# Patient Record
Sex: Female | Born: 1980 | Race: Black or African American | Hispanic: No | Marital: Single | Smoking: Former smoker
Health system: Southern US, Community
[De-identification: ages and names within clinical notes are randomized; demographics above are authoritative.]

## PROBLEM LIST (undated history)

## (undated) ENCOUNTER — Inpatient Hospital Stay (HOSPITAL_COMMUNITY): Payer: Self-pay

## (undated) ENCOUNTER — Inpatient Hospital Stay (HOSPITAL_COMMUNITY): Payer: Medicaid Other

## (undated) DIAGNOSIS — Z87442 Personal history of urinary calculi: Secondary | ICD-10-CM

## (undated) DIAGNOSIS — I10 Essential (primary) hypertension: Secondary | ICD-10-CM

## (undated) DIAGNOSIS — N83209 Unspecified ovarian cyst, unspecified side: Secondary | ICD-10-CM

## (undated) DIAGNOSIS — K219 Gastro-esophageal reflux disease without esophagitis: Secondary | ICD-10-CM

## (undated) DIAGNOSIS — R51 Headache: Secondary | ICD-10-CM

## (undated) HISTORY — PX: OOPHORECTOMY: SHX86

## (undated) HISTORY — PX: TONSILLECTOMY: SUR1361

---

## 2008-04-08 ENCOUNTER — Inpatient Hospital Stay (HOSPITAL_COMMUNITY): Admission: AD | Admit: 2008-04-08 | Discharge: 2008-04-08 | Payer: Self-pay | Admitting: Family Medicine

## 2009-12-24 ENCOUNTER — Other Ambulatory Visit: Payer: Self-pay | Admitting: Emergency Medicine

## 2009-12-24 ENCOUNTER — Emergency Department (HOSPITAL_COMMUNITY): Admission: EM | Admit: 2009-12-24 | Discharge: 2009-12-24 | Payer: Self-pay | Admitting: Emergency Medicine

## 2010-02-27 ENCOUNTER — Emergency Department (HOSPITAL_BASED_OUTPATIENT_CLINIC_OR_DEPARTMENT_OTHER): Admission: EM | Admit: 2010-02-27 | Discharge: 2010-02-27 | Payer: Self-pay | Admitting: Emergency Medicine

## 2011-04-01 LAB — URINALYSIS, ROUTINE W REFLEX MICROSCOPIC
Bilirubin Urine: NEGATIVE
Glucose, UA: NEGATIVE mg/dL
Hgb urine dipstick: NEGATIVE
Ketones, ur: NEGATIVE mg/dL
Protein, ur: NEGATIVE mg/dL
Urobilinogen, UA: 1 mg/dL (ref 0.0–1.0)

## 2011-04-01 LAB — BASIC METABOLIC PANEL
BUN: 13 mg/dL (ref 6–23)
Chloride: 105 mEq/L (ref 96–112)
Creatinine, Ser: 0.7 mg/dL (ref 0.4–1.2)
Glucose, Bld: 85 mg/dL (ref 70–99)

## 2011-04-01 LAB — DIFFERENTIAL
Basophils Absolute: 0 10*3/uL (ref 0.0–0.1)
Basophils Relative: 1 % (ref 0–1)
Eosinophils Absolute: 0.2 10*3/uL (ref 0.0–0.7)
Eosinophils Relative: 4 % (ref 0–5)
Lymphocytes Relative: 33 % (ref 12–46)
Monocytes Absolute: 0.4 10*3/uL (ref 0.1–1.0)

## 2011-04-01 LAB — CBC
HCT: 40 % (ref 36.0–46.0)
Hemoglobin: 13.8 g/dL (ref 12.0–15.0)
MCHC: 34.5 g/dL (ref 30.0–36.0)
Platelets: 241 10*3/uL (ref 150–400)
RDW: 12.7 % (ref 11.5–15.5)

## 2011-04-01 LAB — HCG, QUANTITATIVE, PREGNANCY

## 2011-04-01 LAB — GC/CHLAMYDIA PROBE AMP, GENITAL

## 2011-04-01 LAB — RPR: RPR Ser Ql: NONREACTIVE

## 2011-04-01 LAB — WET PREP, GENITAL
WBC, Wet Prep HPF POC: NONE SEEN
Yeast Wet Prep HPF POC: NONE SEEN

## 2011-07-22 ENCOUNTER — Encounter: Payer: Self-pay | Admitting: *Deleted

## 2011-07-22 ENCOUNTER — Emergency Department (HOSPITAL_BASED_OUTPATIENT_CLINIC_OR_DEPARTMENT_OTHER)
Admission: EM | Admit: 2011-07-22 | Discharge: 2011-07-22 | Disposition: A | Discharge status: Home / Self Care | Payer: Self-pay | Attending: Emergency Medicine | Admitting: Emergency Medicine

## 2011-07-22 DIAGNOSIS — I10 Essential (primary) hypertension: Secondary | ICD-10-CM | POA: Insufficient documentation

## 2011-07-22 DIAGNOSIS — J40 Bronchitis, not specified as acute or chronic: Secondary | ICD-10-CM | POA: Insufficient documentation

## 2011-07-22 DIAGNOSIS — F17200 Nicotine dependence, unspecified, uncomplicated: Secondary | ICD-10-CM | POA: Insufficient documentation

## 2011-07-22 DIAGNOSIS — J45909 Unspecified asthma, uncomplicated: Secondary | ICD-10-CM | POA: Insufficient documentation

## 2011-07-22 DIAGNOSIS — J040 Acute laryngitis: Secondary | ICD-10-CM | POA: Insufficient documentation

## 2011-07-22 DIAGNOSIS — R509 Fever, unspecified: Secondary | ICD-10-CM | POA: Insufficient documentation

## 2011-07-22 HISTORY — DX: Unspecified ovarian cyst, unspecified side: N83.209

## 2011-07-22 HISTORY — DX: Essential (primary) hypertension: I10

## 2011-07-22 MED ORDER — AZITHROMYCIN 250 MG PO TABS: 250.0000 mg | ORAL_TABLET | Freq: Every day | ORAL | Status: AC

## 2011-07-22 MED ORDER — AZITHROMYCIN 250 MG PO TABS
500.0000 mg | ORAL_TABLET | Freq: Once | ORAL | Status: AC
  Administered 2011-07-22: 500 mg via ORAL
  Filled 2011-07-22: qty 2

## 2011-07-22 NOTE — ED Provider Notes (Signed)
History     Chief Complaint  Patient presents with  . Fever   HPI Comments: No better with otc meds.  Patient is a 30 y.o. female presenting with fever. The history is provided by the patient.  Fever Primary symptoms of the febrile illness include fever and cough. Primary symptoms do not include shortness of breath. The current episode started more than 1 week ago. The problem has been gradually worsening.    Past Medical History  Diagnosis Date  . Hypertension   . Asthma   . Ovarian cyst     Past Surgical History  Procedure Date  . Oophorectomy     left ovary and tube  . Tonsillectomy     Family History  Problem Relation Age of Onset  . Hypertension Father     History  Substance Use Topics  . Smoking status: Current Everyday Smoker -- 4.0 packs/day for 10 years  . Smokeless tobacco: Not on file  . Alcohol Use: Yes     occassionally    OB History    Grav Para Term Preterm Abortions TAB SAB Ect Mult Living   5 3              Review of Systems  Constitutional: Positive for fever.  HENT: Positive for sore throat and voice change.   Respiratory: Positive for cough. Negative for shortness of breath.   Cardiovascular: Negative for chest pain and palpitations.  All other systems reviewed and are negative.    Physical Exam  BP 141/86  Pulse 90  Temp(Src) 99.5 F (37.5 C) (Oral)  Resp 20  Ht 5\' 7"  (1.702 m)  Wt 209 lb (94.802 kg)  BMI 32.73 kg/m2  SpO2 100%  LMP 07/09/2011  Physical Exam  Constitutional: She appears well-developed and well-nourished. No distress.  HENT:  Head: Normocephalic and atraumatic.  Right Ear: External ear normal.  Left Ear: External ear normal.  Neck: Normal range of motion. Neck supple.  Cardiovascular: Normal rate, regular rhythm and normal heart sounds.  Exam reveals no gallop and no friction rub.   No murmur heard. Pulmonary/Chest: Effort normal and breath sounds normal. No respiratory distress. She has no wheezes. She  exhibits no tenderness.  Abdominal: Soft. She exhibits no distension. There is no tenderness.  Skin: She is not diaphoretic.    ED Course  Procedures  MDM       Geoffery Lyons 07/23/11 1311

## 2011-07-22 NOTE — ED Notes (Signed)
Migraines, fevers , chills , cold sweats and bodyaches for the last 9 days.  Symptoms have been intermittent. Taking otc medications with minimal relief.

## 2011-08-09 ENCOUNTER — Emergency Department (HOSPITAL_BASED_OUTPATIENT_CLINIC_OR_DEPARTMENT_OTHER)
Admission: EM | Admit: 2011-08-09 | Discharge: 2011-08-09 | Disposition: A | Discharge status: Home / Self Care | Payer: Self-pay | Attending: Emergency Medicine | Admitting: Emergency Medicine

## 2011-08-09 ENCOUNTER — Encounter (HOSPITAL_BASED_OUTPATIENT_CLINIC_OR_DEPARTMENT_OTHER): Payer: Self-pay

## 2011-08-09 DIAGNOSIS — B9689 Other specified bacterial agents as the cause of diseases classified elsewhere: Secondary | ICD-10-CM

## 2011-08-09 DIAGNOSIS — N390 Urinary tract infection, site not specified: Secondary | ICD-10-CM

## 2011-08-09 DIAGNOSIS — M549 Dorsalgia, unspecified: Secondary | ICD-10-CM | POA: Insufficient documentation

## 2011-08-09 DIAGNOSIS — A499 Bacterial infection, unspecified: Secondary | ICD-10-CM | POA: Insufficient documentation

## 2011-08-09 DIAGNOSIS — N760 Acute vaginitis: Secondary | ICD-10-CM | POA: Insufficient documentation

## 2011-08-09 DIAGNOSIS — R109 Unspecified abdominal pain: Secondary | ICD-10-CM | POA: Insufficient documentation

## 2011-08-09 LAB — URINALYSIS, ROUTINE W REFLEX MICROSCOPIC
Glucose, UA: NEGATIVE mg/dL
Ketones, ur: NEGATIVE mg/dL
Protein, ur: 100 mg/dL — AB
Urobilinogen, UA: 0.2 mg/dL (ref 0.0–1.0)

## 2011-08-09 LAB — WET PREP, GENITAL: Yeast Wet Prep HPF POC: NONE SEEN

## 2011-08-09 LAB — URINE MICROSCOPIC-ADD ON

## 2011-08-09 LAB — PREGNANCY, URINE: Preg Test, Ur: NEGATIVE

## 2011-08-09 MED ORDER — CEFIXIME 400 MG PO TABS
400.0000 mg | ORAL_TABLET | Freq: Once | ORAL | Status: AC
  Administered 2011-08-09: 400 mg via ORAL
  Filled 2011-08-09: qty 1

## 2011-08-09 MED ORDER — HYDROCODONE-ACETAMINOPHEN 5-325 MG PO TABS
2.0000 | ORAL_TABLET | Freq: Once | ORAL | Status: AC
  Administered 2011-08-09: 2 via ORAL
  Filled 2011-08-09: qty 2

## 2011-08-09 MED ORDER — CEPHALEXIN 500 MG PO CAPS: 500.0000 mg | ORAL_CAPSULE | Freq: Four times a day (QID) | ORAL | Status: AC

## 2011-08-09 MED ORDER — AZITHROMYCIN 250 MG PO TABS
1000.0000 mg | ORAL_TABLET | Freq: Once | ORAL | Status: AC
  Administered 2011-08-09: 1000 mg via ORAL
  Filled 2011-08-09: qty 4

## 2011-08-09 MED ORDER — METRONIDAZOLE 500 MG PO TABS: 500.0000 mg | ORAL_TABLET | Freq: Two times a day (BID) | ORAL | Status: AC

## 2011-08-09 MED ORDER — ONDANSETRON HCL 8 MG PO TABS
4.0000 mg | ORAL_TABLET | Freq: Once | ORAL | Status: DC
  Filled 2011-08-09: qty 1

## 2011-08-09 MED ORDER — METRONIDAZOLE 500 MG PO TABS
500.0000 mg | ORAL_TABLET | Freq: Once | ORAL | Status: AC
  Administered 2011-08-09: 500 mg via ORAL
  Filled 2011-08-09: qty 1

## 2011-08-09 MED ORDER — ONDANSETRON 4 MG PO TBDP
ORAL_TABLET | ORAL | Status: AC
  Administered 2011-08-09: 4 mg via ORAL
  Filled 2011-08-09: qty 1

## 2011-08-09 MED ORDER — HYDROCODONE-ACETAMINOPHEN 5-325 MG PO TABS: ORAL_TABLET | ORAL | Status: DC

## 2011-08-09 NOTE — ED Provider Notes (Signed)
History     CSN: [id number] Arrival date & time: 08/09/2011  3:27 PM  Chief Complaint  Patient presents with  . Back Pain  . Abdominal Pain  . Flank Pain   Patient is a 30 y.o. female presenting with back pain, abdominal pain, and flank pain. The history is provided by the patient.  Back Pain  This is a new problem. The current episode started more than 2 days ago. The problem occurs constantly. The problem has been gradually worsening. The pain is associated with no known injury. Pain location: rt flank/lower back. The quality of the pain is described as aching. Radiates to: pubis. The pain is at a severity of 9/10. The pain is severe. Associated symptoms include abdominal pain. Pertinent negatives include no chest pain, no fever, no bladder incontinence and no dysuria. She has tried analgesics for the symptoms.  Abdominal Pain The primary symptoms of the illness include abdominal pain. The primary symptoms of the illness do not include fever, shortness of breath or dysuria.  Additional symptoms associated with the illness include back pain. Symptoms associated with the illness do not include hematuria or frequency.  Flank Pain Associated symptoms include abdominal pain. Pertinent negatives include no arthralgias, chest pain, coughing, fever or neck pain.    Past Medical History  Diagnosis Date  . Hypertension   . Asthma   . Ovarian cyst     Past Surgical History  Procedure Date  . Oophorectomy     left ovary and tube  . Tonsillectomy   . Oophorectomy     Family History  Problem Relation Age of Onset  . Hypertension Father     History  Substance Use Topics  . Smoking status: Current Everyday Smoker -- 4.0 packs/day for 10 years  . Smokeless tobacco: Not on file  . Alcohol Use: Yes     occassionally    OB History    Grav Para Term Preterm Abortions TAB SAB Ect Mult Living   5 3              Review of Systems  Constitutional: Negative for fever and activity  change.       All ROS Neg except as noted in HPI  HENT: Negative for nosebleeds and neck pain.   Eyes: Negative for photophobia and discharge.  Respiratory: Negative for cough, shortness of breath and wheezing.   Cardiovascular: Negative for chest pain and palpitations.  Gastrointestinal: Positive for abdominal pain. Negative for blood in stool.  Genitourinary: Positive for flank pain. Negative for bladder incontinence, dysuria, frequency and hematuria.  Musculoskeletal: Positive for back pain. Negative for arthralgias.  Skin: Negative.   Neurological: Negative for dizziness, seizures and speech difficulty.  Psychiatric/Behavioral: Negative for hallucinations and confusion.    Physical Exam  BP 132/96  Pulse 72  Temp(Src) 98.4 F (36.9 C) (Oral)  Resp 16  Ht 5\' 7"  (1.702 m)  Wt 203 lb (92.08 kg)  BMI 31.79 kg/m2  SpO2 99%  LMP 08/05/2011  Physical Exam  Nursing note and vitals reviewed. Constitutional: She is oriented to person, place, and time. She appears well-developed and well-nourished.  Non-toxic appearance.  HENT:  Head: Normocephalic.  Right Ear: Tympanic membrane and external ear normal.  Left Ear: Tympanic membrane and external ear normal.  Eyes: EOM and lids are normal. Pupils are equal, round, and reactive to light.  Neck: Normal range of motion. Neck supple. Carotid bruit is not present.  Cardiovascular: Normal rate, regular rhythm, normal heart sounds,  intact distal pulses and normal pulses.   Pulmonary/Chest: Breath sounds normal. No respiratory distress.  Abdominal: Soft. Bowel sounds are normal. There is tenderness. There is no guarding.       Rt lower quad tenderness.  Genitourinary:       Rt CVA tenderness. Chaperone present during pelvic exam. External structures wnl. No fb of the vaginal vault. Mild blood discharge for os of cervix (pt ending cycle). Mild to mod cervical motion tenderness. No adnexal mass noted.  Musculoskeletal: Normal range of motion.    Lymphadenopathy:       Head (right side): No submandibular adenopathy present.       Head (left side): No submandibular adenopathy present.    She has no cervical adenopathy.  Neurological: She is alert and oriented to person, place, and time. She has normal strength. No cranial nerve deficit or sensory deficit.  Skin: Skin is warm and dry.  Psychiatric: She has a normal mood and affect. Her speech is normal.    ED Course  Procedures  MDM I have reviewed nursing notes, vital signs, and all appropriate lab and imaging results for this patient. R/o UTI, PID, Kidney Stone, Ovarian cyst.      Kathie Dike, PA 08/09/11 [address] [place], [place] 08/09/11 [phone]

## 2011-08-09 NOTE — ED Notes (Signed)
Pt was made aware of wait for wet prep and that EDPA would d/c asap-agreeable

## 2011-08-09 NOTE — ED Notes (Signed)
Pt reports urinary frequency, low back, right flank pain radiating to pubic area. Onset Tuesday

## 2011-08-10 LAB — GC/CHLAMYDIA PROBE AMP, GENITAL
Chlamydia, DNA Probe: NEGATIVE
GC Probe Amp, Genital: NEGATIVE

## 2011-08-18 NOTE — ED Provider Notes (Signed)
Medical screening examination/treatment/procedure(s) were performed by non-physician practitioner and as supervising physician I was immediately available for consultation/collaboration.   Cyndra Numbers, MD 08/18/11 [phone]

## 2011-10-28 LAB — CBC
Hemoglobin: 12.6 g/dL (ref 12.0–16.0)
Platelets: 245 10*3/uL (ref 150–399)
Platelets: 245 10*3/uL (ref 150–399)

## 2011-10-28 LAB — GLUCOSE TOLERANCE, 1 HOUR: Glucose, 1 hour: 81

## 2011-11-05 DIAGNOSIS — O039 Complete or unspecified spontaneous abortion without complication: Secondary | ICD-10-CM | POA: Insufficient documentation

## 2011-11-07 ENCOUNTER — Ambulatory Visit (INDEPENDENT_AMBULATORY_CARE_PROVIDER_SITE_OTHER): Payer: Medicaid Other | Admitting: Family Medicine

## 2011-11-07 DIAGNOSIS — N390 Urinary tract infection, site not specified: Secondary | ICD-10-CM

## 2011-11-07 DIAGNOSIS — O2340 Unspecified infection of urinary tract in pregnancy, unspecified trimester: Secondary | ICD-10-CM

## 2011-11-07 DIAGNOSIS — O10019 Pre-existing essential hypertension complicating pregnancy, unspecified trimester: Secondary | ICD-10-CM

## 2011-11-07 DIAGNOSIS — O2390 Unspecified genitourinary tract infection in pregnancy, unspecified trimester: Secondary | ICD-10-CM

## 2011-11-07 DIAGNOSIS — J45909 Unspecified asthma, uncomplicated: Secondary | ICD-10-CM | POA: Insufficient documentation

## 2011-11-07 DIAGNOSIS — O039 Complete or unspecified spontaneous abortion without complication: Secondary | ICD-10-CM

## 2011-11-07 DIAGNOSIS — N883 Incompetence of cervix uteri: Secondary | ICD-10-CM | POA: Insufficient documentation

## 2011-11-07 HISTORY — DX: Unspecified infection of urinary tract in pregnancy, unspecified trimester: O23.40

## 2011-11-07 HISTORY — DX: Pre-existing essential hypertension complicating pregnancy, unspecified trimester: O10.019

## 2011-11-07 HISTORY — DX: Incompetence of cervix uteri: N88.3

## 2011-11-07 LAB — POCT URINALYSIS DIP (DEVICE)
Bilirubin Urine: NEGATIVE
Ketones, ur: NEGATIVE mg/dL
Nitrite: POSITIVE — AB

## 2011-11-07 NOTE — Progress Notes (Signed)
Pelvic pain.  Had flu shot at [facility]. Needs nutrition and SW today

## 2011-11-07 NOTE — Progress Notes (Signed)
Nutrition Note:  1st consult @ [facility].  Pt receives [facility] services.  Dx. HTN; asthma; ovarian cyst; overwt. Current wt gain of 7# @ [redacted]w[redacted]d gestation is excessive, plots 3# > expected.  Pt reports large appetite of 3 big meals and 3 snacks daily.  No food allergies or vomiting reported; does experience nausea.  Pt does consume an excessive amount of orange juice 4-5 times daily, which likely contributes 500+ extra calories daily. Pt reports working and walking daily. Will start to dilute all orange juice and find other zero calorie beverages. Follow up in 4-6 weeks. Cy Blamer, RD

## 2011-11-07 NOTE — Patient Instructions (Addendum)
Cervical Insufficiency Cervical insufficiency (CI) is when the cervix is not strong enough to keep a baby (fetus) inside the womb (uterus). It occurs in the 2nd and early 3rd trimesters of pregnancy. The cervix will enlarge (dilate) on its own without contractions. When this happens, the membranes around the fetus will often balloon down into the birth canal (vagina). The membranes may break, which could end the pregnancy (miscarriage). CAUSES  The cause of a cervical insufficiency is often not known. Possible causes include:  Injury to the cervix from a past pregnancy.   Injury to the cervix from past surgeries.   Being born with this defect of the cervix.   Cold cone, laser or LEEP (Loop electrocautery excision procedure) to the cervix.   Over dilating the cervix during an abortion.   Being exposed to DES (diethylstilbestrol) during pregnancy.   Lack of tissue (elastin and collagen) in the cervix that holds the baby in uterus.   Shorter cervix than normal.  SYMPTOMS   Spotting or bleeding from the vagina.   Feeling pressure in the vagina.   Unusual or abnormal vaginal discharge.  DIAGNOSIS   In many cases, the diagnosis is not made until after the pregnancy is lost.   Often this diagnosis will be made by exam.   Sometimes, an ultrasound of the cervix may be helpful. The ultrasound measures and follows the length of the cervix in women who are at risk of having CI.   Your caregiver may follow the dilatation of the cervix. Often, the diagnosis cannot be made until it happens. When this is the case, there is a much greater chance of early loss of the pregnancy. This means the baby is born too early to survive outside of the mother.  PREVENTION   A high risk patient needs to get frequent vaginal exams and serial ultrasounds.   Tie a suture, like a purse string, around the cervix (cerclage), before getting pregnant.  TREATMENT  When CI is diagnosed early, the treatment is a  cerclage. This gives the cervix added support. The cerclage helps carry the baby to term. This is usually done before the first trimester (12 to 14 weeks). Cerclage is usually not done after the second trimester (24 weeks) unless it is an emergency. Your caregiver can discuss the risks of this procedure. The cerclage suture may be removed when labor begins or at term before labor begins. The suture can also be left in place for future pregnancies. If left in place, the baby is delivered by Cesarean section. HOME CARE INSTRUCTIONS   Keep your follow-up prenatal appointments.   Take medication as directed by your caregiver.   Avoid physical activities, exercise and sexual intercourse until you have permission from your caregiver.   Do not douche or use tampons.   Resume your usual diet.  SEEK MEDICAL CARE IF:  You develop abnormal vaginal discharge. SEEK IMMEDIATE MEDICAL CARE IF:   You have a fever.   You develop uterine contractions.   You do not feel the baby moving or the baby is not moving as much as usual.   You pass out.   You have vaginal bleeding.   You are leaking fluid or have a gush of fluid from your vagina.   You have blood in your urine or pain when urinating.  Document Released: 12/16/2005 Document Revised: 08/28/2011 Document Reviewed: 04/05/2009 [facility] Patient Information 2012 [place], [place].Hypertension During Pregnancy Hypertension is also called high blood pressure. It can occur at any  time in life and during pregnancy. When you have hypertension, there is extra pressure inside your blood vessels that carry blood from the heart to the rest of your body (arteries). Hypertension during pregnancy can cause problems for you and your baby. Your baby might not weigh as much as it should at birth or might be born early (premature). Very bad cases of hypertension during pregnancy can be life-threatening.  There are different types of hypertension during pregnancy.    Chronic hypertension. This happens when a woman has hypertension before pregnancy and it continues during pregnancy.   Gestational hypertension. This is when hypertension develops during pregnancy.   Preeclampsia or toxemia of pregnancy. This is a very serious type of hypertension that develops only during pregnancy. It is a disease that affects the whole body (systemic) and can be very dangerous for both mother and baby.   Gestational hypertension and preeclampsia usually go away after your baby is born. Blood pressure generally stabilizes within 6 weeks. Women who have hypertension during pregnancy have a greater chance of developing hypertension later in life or with future pregnancies. UNDERSTANDING BLOOD PRESSURE Blood pressure moves blood in your body. Sometimes, the force that moves the blood becomes too strong.  A blood pressure reading is given in 2 numbers and looks like a fraction.   The top number is called the systolic pressure. When your heart beats, it forces more blood to flow through the arteries. Pressure inside the arteries goes up.   The bottom number is the diastolic pressure. Pressure goes down between beats. That is when the heart is resting.   You may have hypertension if:   Your systolic blood pressure is above 140.   Your diastolic pressure is above 90.  RISK FACTORS Some factors make you more likely to develop hypertension during pregnancy. Risk factors include:  Having hypertension before pregnancy.   Having hypertension during a previous pregnancy.   Being overweight.   Being older than 40.   Being pregnant with more than 1 baby (multiples).   Having diabetes or kidney problems.  SYMPTOMS Chronic and gestational hypertension may not cause symptoms. Preeclampsia has symptoms, which may include:  Increased protein in your urine. Your caregiver will check for this at every prenatal visit.   Swelling of your hands and face.   Rapid weight gain.    Headaches.   Visual changes.   Being bothered by light.   Abdominal pain, especially in the right upper area.   Chest pain.   Shortness of breath.   Increased reflexes.   Seizures. Seizures occur with a more severe form of preeclampsia, called eclampsia.  DIAGNOSIS   You may be diagnosed with hypertension during pregnancy during a regular prenatal exam. At each visit, tests may include:   Blood pressure checks.   A urine test to check for protein in your urine.   The type of hypertension you are diagnosed with depends on when you developed it. It also depends on your specific blood pressure reading.   Developing hypertension before 20 weeks of pregnancy is consistent with chronic hypertension.   Developing hypertension after 20 weeks of pregnancy is consistent with gestational hypertension.   Hypertension with increased urinary protein is diagnosed as preeclampsia.   Blood pressure measurements that stay above 160 systolic or 110 diastolic are a sign of severe preeclampsia.  TREATMENT Treatment for hypertension during pregnancy varies. Treatment depends on the type of hypertension and how serious it is.  If you take medicine  for chronic hypertension, you may need to switch medicines.   Drugs called ACE inhibitors should not be taken during pregnancy.   Low-dose aspirin may be suggested for women who have risk factors for preeclampsia.   If you have gestational hypertension, you may need to take a blood pressure medicine that is safe during pregnancy. Your caregiver will recommend the appropriate medicine.   If you have severe preeclampsia, you may need to be in the hospital. Caregivers will watch you and the baby very closely. You also may need to take medicine (magnesium sulfate) to prevent seizures and lower blood pressure.   Sometimes an early delivery is needed. This may be the case if the condition worsens. It would be done to protect you and the baby. The only  cure for preeclampsia is delivery.  HOME CARE INSTRUCTIONS  Schedule and keep all of your regular prenatal care.   Follow your caregiver's instructions for taking medicines. Tell your caregiver about all medicines you take. This includes over-the-counter medicines.   Eat as little salt as possible.   Get regular exercise.   Do not drink alcohol.   Do not use tobacco products.   Do not drink products with caffeine.   Lie on your left side when resting.   Tell your doctor if you have any preeclampsia symptoms.  SEEK IMMEDIATE MEDICAL CARE IF:  You have severe abdominal pain.   You have sudden swelling in the hands, ankles, or face.   You gain 4 pounds (1.8 kg) or more in 1 week.   You vomit repeatedly.   You have vaginal bleeding.   You do not feel the baby moving as much.   You have a headache.   You have blurred or double vision.   You have muscle twitching or spasms.   You have shortness of breath.   You have blue fingernails and lips.   You have blood in your urine.  MAKE SURE YOU:  Understand these instructions.   Will watch your condition.   Will get help right away if you are not doing well.  Document Released: 09/03/2011 Document Reviewed: 08/02/2011 [facility] Patient Information 2012 [place], [place].Sterilization, Women Sterilization is a surgical procedure. This surgery permanently prevents pregnancy in women. This can be done by tying (with or without cutting) the fallopian tubes or burning the tubes closed (tubal ligation). Tubal ligation blocks the tubes and prevents the egg from being fertilized by the sperm. Sterilization can be done by removing the ovaries that produce the egg (castration) as well. Sterilization is considered safe with very rare complications. It does not affect menstrual periods, sexual desire, or performance.  Since sterilization is considered permanent, you should not do it until you are sure you do not want to have more  children. You and your partner should fully agree to have the procedure. Your decision to have the procedure should not be made when you are in a stressful situation. This can include a loss of a pregnancy, illness or death of a spouse, or divorce. There are other means of preventing unwanted pregnancies that can be used until you are completely sure you want to be sterilized. Sterilization does not protect against sexually transmitted disease. Women who had a sterilization procedure and want it reversed must know that it requires an expensive and major operation. The reversal may not be successful and has a high rate of tubal (ectopic) pregnancy that can be dangerous and require surgery. There are several ways to perform a  tubal sterlization:  Laparoscopy. The abdomen is filled with a gas to see the pelvic organs. Then, a tube with a light attached is inserted into the abdomen through 2 small incisions. The fallopian tubes are blocked with a ring, clip or electrocautery to burn closed the tubes. Then, the gas is released and the small incisions are closed.   Hysteroscopy. A tube with a light is inserted in the vagina, through the cervix and then into the uterus. A spring-like instrument is inserted into the opening of the fallopian tubes. The spring causes scaring and blocks the tubes. Other forms of contraception should be used for three months at which time an X-ray is done to be sure the tubes are blocked.   Minilaparotomy. This is done right after giving birth. A small incision is made under the belly button and the tubes are exposed. The tubes can then be burned, tied and/or cut.   Tubal ligation can be done during a Cesarean section.   Castration is a surgical procedure that removes both ovaries.  Tubal sterilization should be discussed with your caregiver to answer any concerns you or your partner might have. This meeting will help to decide for sure if the operation is safe for you and which  procedure is the best one for you. You can change your mind and cancel the surgery at any time. HOME CARE INSTRUCTIONS   Follow your caregivers instructions regarding diet, rest, work, social and sexual activities and follow up appointments.   Shoulder pain is common following a laparoscopy. The pain may be relieved by lying down flat.   Only take over-the-counter or prescription medicines for pain, discomfort or fever as directed by your caregiver.   You may use lozenges for throat discomfort.   Keep the incisions covered to prevent infection.  SEEK IMMEDIATE MEDICAL CARE IF:   You develop a temperature of 102 F (38.9 C), or as your caregiver suggests.   You become dizzy or faint.   You start to feel sick to your stomach (nausea) or throw up (vomit).   You develop abdominal pain not relieved with over-the-counter medications.   You have redness and puffiness (swelling) of the cut (incision).   You see pus draining from the incision.   You miss a menstrual period.  Document Released: 06/03/2008 Document Revised: 08/28/2011 Document Reviewed: 06/03/2008 [facility] Patient Information 2012 [place], [place]. Pregnancy - First Trimester During sexual intercourse, millions of sperm go into the vagina. Only 1 sperm will penetrate and fertilize the female egg while it is in the Fallopian tube. One week later, the fertilized egg implants into the wall of the uterus. An embryo begins to develop into a baby. At 6 to 8 weeks, the eyes and face are formed and the heartbeat can be seen on ultrasound. At the end of 12 weeks (first trimester), all the baby's organs are formed. Now that you are pregnant, you will want to do everything you can to have a healthy baby. Two of the most important things are to get good prenatal care and follow your caregiver's instructions. Prenatal care is all the medical care you receive before the baby's birth. It is given to prevent, find, and treat problems during the  pregnancy and childbirth. PRENATAL EXAMS  During prenatal visits, your weight, blood pressure and urine are checked. This is done to make sure you are healthy and progressing normally during the pregnancy.   A pregnant woman should gain 25 to 35 pounds during the pregnancy.  However, if you are over weight or underweight, your caregiver will advise you regarding your weight.   Your caregiver will ask and answer questions for you.   Blood work, cervical cultures, other necessary tests and a Pap test are done during your prenatal exams. These tests are done to check on your health and the probable health of your baby. Tests are strongly recommended and done for HIV with your permission. This is the virus that causes AIDS. These tests are done because medications can be given to help prevent your baby from being born with this infection should you have been infected without knowing it. Blood work is also used to find out your blood type, previous infections and follow your blood levels (hemoglobin).   Low hemoglobin (anemia) is common during pregnancy. Iron and vitamins are given to help prevent this. Later in the pregnancy, blood tests for diabetes will be done along with any other tests if any problems develop. You may need tests to make sure you and the baby are doing well.   You may need other tests to make sure you and the baby are doing well.  CHANGES DURING THE FIRST TRIMESTER (THE FIRST 3 MONTHS OF PREGNANCY) Your body goes through many changes during pregnancy. They vary from person to person. Talk to your caregiver about changes you notice and are concerned about. Changes can include:  Your menstrual period stops.   The egg and sperm carry the genes that determine what you look like. Genes from you and your partner are forming a baby. The female genes determine whether the baby is a boy or a girl.   Your body increases in girth and you may feel bloated.   Feeling sick to your stomach  (nauseous) and throwing up (vomiting). If the vomiting is uncontrollable, call your caregiver.   Your breasts will begin to enlarge and become tender.   Your nipples may stick out more and become darker.   The need to urinate more. Painful urination may mean you have a bladder infection.   Tiring easily.   Loss of appetite.   Cravings for certain kinds of food.   At first, you may gain or lose a couple of pounds.   You may have changes in your emotions from day to day (excited to be pregnant or concerned something may go wrong with the pregnancy and baby).   You may have more vivid and strange dreams.  HOME CARE INSTRUCTIONS   It is very important to avoid all smoking, alcohol and un-prescribed drugs during your pregnancy. These affect the formation and growth of the baby. Avoid chemicals while pregnant to ensure the delivery of a healthy infant.   Start your prenatal visits by the 12th week of pregnancy. They are usually scheduled monthly at first, then more often in the last 2 months before delivery. Keep your caregiver's appointments. Follow your caregiver's instructions regarding medication use, blood and lab tests, exercise, and diet.   During pregnancy, you are providing food for you and your baby. Eat regular, well-balanced meals. Choose foods such as meat, fish, milk and other low fat dairy products, vegetables, fruits, and whole-grain breads and cereals. Your caregiver will tell you of the ideal weight gain.   You can help morning sickness by keeping soda crackers at the bedside. Eat a couple before arising in the morning. You may want to use the crackers without salt on them.   Eating 4 to 5 small meals rather than 3 large  meals a day also may help the nausea and vomiting.   Drinking liquids between meals instead of during meals also seems to help nausea and vomiting.   A physical sexual relationship may be continued throughout pregnancy if there are no other problems.  Problems may be early (premature) leaking of amniotic fluid from the membranes, vaginal bleeding, or belly (abdominal) pain.   Exercise regularly if there are no restrictions. Check with your caregiver or physical therapist if you are unsure of the safety of some of your exercises. Greater weight gain will occur in the last 2 trimesters of pregnancy. Exercising will help:   Control your weight.   Keep you in shape.   Prepare you for labor and delivery.   Help you lose your pregnancy weight after you deliver your baby.   Wear a good support or jogging bra for breast tenderness during pregnancy. This may help if worn during sleep too.   Ask when prenatal classes are available. Begin classes when they are offered.   Do not use hot tubs, steam rooms or saunas.   Wear your seat belt when driving. This protects you and your baby if you are in an accident.   Avoid raw meat, uncooked cheese, cat litter boxes and soil used by cats throughout the pregnancy. These carry germs that can cause birth defects in the baby.   The first trimester is a good time to visit your dentist for your dental health. Getting your teeth cleaned is OK. Use a softer toothbrush and brush gently during pregnancy.   Ask for help if you have financial, counseling or nutritional needs during pregnancy. Your caregiver will be able to offer counseling for these needs as well as refer you for other special needs.   Do not take any medications or herbs unless told by your caregiver.   Inform your caregiver if there is any mental or physical domestic violence.   Make a list of emergency phone numbers of family, friends, hospital, and police and fire departments.   Write down your questions. Take them to your prenatal visit.   Do not douche.   Do not cross your legs.   If you have to stand for long periods of time, rotate you feet or take small steps in a circle.   You may have more vaginal secretions that may require a  sanitary pad. Do not use tampons or scented sanitary pads.  MEDICATIONS AND DRUG USE IN PREGNANCY  Take prenatal vitamins as directed. The vitamin should contain 1 milligram of folic acid. Keep all vitamins out of reach of children. Only a couple vitamins or tablets containing iron may be fatal to a baby or young child when ingested.   Avoid use of all medications, including herbs, over-the-counter medications, not prescribed or suggested by your caregiver. Only take over-the-counter or prescription medicines for pain, discomfort, or fever as directed by your caregiver. Do not use aspirin, ibuprofen, or naproxen unless directed by your caregiver.   Let your caregiver also know about herbs you may be using.   Alcohol is related to a number of birth defects. This includes fetal alcohol syndrome. All alcohol, in any form, should be avoided completely. Smoking will cause low birth rate and premature babies.   Street or illegal drugs are very harmful to the baby. They are absolutely forbidden. A baby born to an addicted mother will be addicted at birth. The baby will go through the same withdrawal an adult does.   Let  your caregiver know about any medications that you have to take and for what reason you take them.  MISCARRIAGE IS COMMON DURING PREGNANCY A miscarriage does not mean you did something wrong. It is not a reason to worry about getting pregnant again. Your caregiver will help you with questions you may have. If you have a miscarriage, you may need minor surgery. SEEK MEDICAL CARE IF:  You have any concerns or worries during your pregnancy. It is better to call with your questions if you feel they cannot wait, rather than worry about them. SEEK IMMEDIATE MEDICAL CARE IF:   An unexplained oral temperature above 102 F (38.9 C) develops, or as your caregiver suggests.   You have leaking of fluid from the vagina (birth canal). If leaking membranes are suspected, take your temperature and  inform your caregiver of this when you call.   There is vaginal spotting or bleeding. Notify your caregiver of the amount and how many pads are used.   You develop a bad smelling vaginal discharge with a change in the color.   You continue to feel sick to your stomach (nauseated) and have no relief from remedies suggested. You vomit blood or coffee ground-like materials.   You lose more than 2 pounds of weight in 1 week.   You gain more than 2 pounds of weight in 1 week and you notice swelling of your face, hands, feet, or legs.   You gain 5 pounds or more in 1 week (even if you do not have swelling of your hands, face, legs, or feet).   You get exposed to [place] measles and have never had them.   You are exposed to fifth disease or chickenpox.   You develop belly (abdominal) pain. Round ligament discomfort is a common non-cancerous (benign) cause of abdominal pain in pregnancy. Your caregiver still must evaluate this.   You develop headache, fever, diarrhea, pain with urination, or shortness of breath.   You fall or are in a car accident or have any kind of trauma.   There is mental or physical violence in your home.  Document Released: 12/10/2001 Document Revised: 08/28/2011 Document Reviewed: 06/13/2009 [facility] Patient Information 2012 [place], [place].

## 2011-11-07 NOTE — Progress Notes (Signed)
NOB-h/o 16 week silent dilation and delivery, last preg.  Prev. FT delivery x 3.  One TAB preceding. For cerclage @ 13 wks Needs First trim. Screen. HTN since July--BP is up, will hold on meds until next visit-baseline labs.

## 2011-11-09 LAB — CULTURE, OB URINE

## 2011-11-11 ENCOUNTER — Telehealth: Payer: Self-pay | Admitting: *Deleted

## 2011-11-11 DIAGNOSIS — O2340 Unspecified infection of urinary tract in pregnancy, unspecified trimester: Secondary | ICD-10-CM

## 2011-11-11 DIAGNOSIS — O10019 Pre-existing essential hypertension complicating pregnancy, unspecified trimester: Secondary | ICD-10-CM

## 2011-11-11 DIAGNOSIS — O039 Complete or unspecified spontaneous abortion without complication: Secondary | ICD-10-CM

## 2011-11-11 MED ORDER — CEPHALEXIN 500 MG PO CAPS: 500.0000 mg | ORAL_CAPSULE | Freq: Three times a day (TID) | ORAL | Status: AC

## 2011-11-11 NOTE — Telephone Encounter (Signed)
Message copied by Mannie Stabile on Mon Nov 11, 2011  8:37 AM ------      Message from: Reva Bores      Created: Sun Nov 10, 2011  6:30 AM       Citrobacter UTI--needs rx-Keflex 500mg  po tid x 7 d # 21

## 2011-11-11 NOTE — Telephone Encounter (Signed)
Spoke with patient advised her that we have sent in her prescription for her abx. Pt agrees with plan and will pick up her medications at pharmacy.

## 2011-11-20 ENCOUNTER — Encounter (HOSPITAL_COMMUNITY): Payer: Self-pay | Admitting: Pharmacist

## 2011-11-25 ENCOUNTER — Telehealth: Payer: Self-pay | Admitting: *Deleted

## 2011-11-25 NOTE — Patient Instructions (Addendum)
   Your procedure is scheduled WU:[id number] December 3rd  Enter through the Main Entrance of [facility] at:9am Pick up the phone at the desk and dial [phone] and inform us of your arrival.  Please call this number if you have any problems the morning of surgery: [phone]  Remember: Do not eat food after midnight:Sunday Do not drink clear liquids after:midnight Sunday Take these medicines the morning of surgery with a SIP OF WATER:none, does not have current inhaler  Do not wear jewelry, make-up, or FINGER nail polish Do not wear lotions, powders, or perfumes.  You may not  wear deodorant. Do not shave 48 hours prior to surgery. Do not bring valuables to the hospital.  Leave suitcase in the car. After Surgery it may be brought to your room. For patients being admitted to the hospital, checkout time is 11:00am the day of discharge.  Patients discharged on the day of surgery will not be allowed to drive home.     Remember to use your hibiclens as instructed.Please shower with 1/2 bottle the evening before your surgery and the other 1/2 bottle the morning of surgery.

## 2011-11-25 NOTE — Telephone Encounter (Signed)
Pt left message that she is having pain and has questions- wants to speak to a nurse.  I returned pt call and discussed her pain. She states that since early this morning she is having a pain on the lt side of pelvis. The pain is stronger when she is standing and is non-existent when sitting straight. She denies vaginal bleeding or severe pain. I told pt that I think she has round ligament pain and provided explanation. Pt asked if this will prevent her from working because she stands for 8 hrs without getting a break or lunch. I told pt that only she can decide whether she will be able to work and some days may be worse than others. I also recommended that pt discuss the situation with the doctor and social worker @ her next visit on 11/29.  I advised pt to go to [facility] if she should have severe abdominal/pelvic pain or vaginal bleeding. Pt voiced understanding.

## 2011-11-27 ENCOUNTER — Encounter (HOSPITAL_COMMUNITY): Payer: Self-pay

## 2011-11-27 ENCOUNTER — Encounter (HOSPITAL_COMMUNITY)
Admission: RE | Admit: 2011-11-27 | Discharge: 2011-11-27 | Disposition: A | Discharge status: Home / Self Care | Payer: Medicaid Other | Source: Ambulatory Visit | Attending: Obstetrics & Gynecology | Admitting: Obstetrics & Gynecology

## 2011-11-27 LAB — CBC
Hemoglobin: 12.8 g/dL (ref 12.0–15.0)
MCHC: 33.9 g/dL (ref 30.0–36.0)
RBC: 4.15 MIL/uL (ref 3.87–5.11)

## 2011-11-28 ENCOUNTER — Other Ambulatory Visit: Payer: Self-pay | Admitting: Obstetrics and Gynecology

## 2011-11-28 ENCOUNTER — Ambulatory Visit (INDEPENDENT_AMBULATORY_CARE_PROVIDER_SITE_OTHER): Payer: Medicaid Other | Admitting: Obstetrics and Gynecology

## 2011-11-28 DIAGNOSIS — O10019 Pre-existing essential hypertension complicating pregnancy, unspecified trimester: Secondary | ICD-10-CM

## 2011-11-28 DIAGNOSIS — N390 Urinary tract infection, site not specified: Secondary | ICD-10-CM

## 2011-11-28 DIAGNOSIS — O2340 Unspecified infection of urinary tract in pregnancy, unspecified trimester: Secondary | ICD-10-CM

## 2011-11-28 DIAGNOSIS — O2390 Unspecified genitourinary tract infection in pregnancy, unspecified trimester: Secondary | ICD-10-CM

## 2011-11-28 DIAGNOSIS — O0990 Supervision of high risk pregnancy, unspecified, unspecified trimester: Secondary | ICD-10-CM

## 2011-11-28 LAB — POCT URINALYSIS DIP (DEVICE)
Bilirubin Urine: NEGATIVE
Glucose, UA: NEGATIVE mg/dL
Nitrite: POSITIVE — AB

## 2011-11-28 MED ORDER — CEPHALEXIN 500 MG PO CAPS: 500.0000 mg | ORAL_CAPSULE | Freq: Four times a day (QID) | ORAL | Status: DC

## 2011-11-28 NOTE — Progress Notes (Signed)
Appt. Made at [facility] on 12/04/11 at 2pm for first trimester screening.

## 2011-11-28 NOTE — Progress Notes (Signed)
Addended by: Faythe Casa on: 11/28/2011 09:57 AM   Modules accepted: Orders

## 2011-11-28 NOTE — Progress Notes (Signed)
Has discomfort in pelvic area.

## 2011-11-28 NOTE — Progress Notes (Signed)
Patient doing well without complaints. Questions answered regarding upcoming cerclage on 12/3. Patient with UTI will treat with Keflex. Urine culture for sensitivities sent. Will try to schedule nuchal translucency. RTC in 2 weeks

## 2011-11-29 LAB — OBSTETRIC PANEL
Basophils Absolute: 0 10*3/uL (ref 0.0–0.1)
Basophils Relative: 0 % (ref 0–1)
Eosinophils Absolute: 0.1 10*3/uL (ref 0.0–0.7)
Hepatitis B Surface Ag: NEGATIVE
Lymphs Abs: 1.6 10*3/uL (ref 0.7–4.0)
MCH: 30.6 pg (ref 26.0–34.0)
MCHC: 33.8 g/dL (ref 30.0–36.0)
Neutrophils Relative %: 63 % (ref 43–77)
Platelets: 262 10*3/uL (ref 150–400)
RBC: 4.31 MIL/uL (ref 3.87–5.11)
RDW: 13.2 % (ref 11.5–15.5)

## 2011-11-29 LAB — PROTEIN, URINE, 24 HOUR: Protein, 24H Urine: 273 mg/d — ABNORMAL HIGH (ref 50–100)

## 2011-12-01 LAB — CULTURE, OB URINE: Colony Count: >=100000

## 2011-12-01 MED ORDER — CEFOTETAN DISODIUM 2 G IJ SOLR
2.0000 g | INTRAMUSCULAR | Status: AC
  Administered 2011-12-02: 2 g via INTRAVENOUS
  Filled 2011-12-01: qty 2

## 2011-12-02 ENCOUNTER — Ambulatory Visit (HOSPITAL_COMMUNITY): Payer: Medicaid Other | Admitting: Anesthesiology

## 2011-12-02 ENCOUNTER — Encounter (HOSPITAL_COMMUNITY): Payer: Self-pay | Admitting: Obstetrics & Gynecology

## 2011-12-02 ENCOUNTER — Encounter (HOSPITAL_COMMUNITY)
Admission: RE | Disposition: A | Discharge status: Home / Self Care | Payer: Self-pay | Source: Ambulatory Visit | Attending: Obstetrics & Gynecology

## 2011-12-02 ENCOUNTER — Encounter (HOSPITAL_COMMUNITY): Payer: Self-pay | Admitting: Anesthesiology

## 2011-12-02 ENCOUNTER — Ambulatory Visit (HOSPITAL_COMMUNITY)
Admission: RE | Admit: 2011-12-02 | Discharge: 2011-12-02 | Disposition: A | Discharge status: Home / Self Care | Payer: Medicaid Other | Source: Ambulatory Visit | Attending: Obstetrics & Gynecology | Admitting: Obstetrics & Gynecology

## 2011-12-02 DIAGNOSIS — Z01812 Encounter for preprocedural laboratory examination: Secondary | ICD-10-CM | POA: Insufficient documentation

## 2011-12-02 DIAGNOSIS — Z01818 Encounter for other preprocedural examination: Secondary | ICD-10-CM | POA: Insufficient documentation

## 2011-12-02 DIAGNOSIS — N883 Incompetence of cervix uteri: Secondary | ICD-10-CM

## 2011-12-02 DIAGNOSIS — O3430 Maternal care for cervical incompetence, unspecified trimester: Secondary | ICD-10-CM | POA: Insufficient documentation

## 2011-12-02 HISTORY — PX: CERVICAL CERCLAGE: SHX1329

## 2011-12-02 LAB — HEMOGLOBINOPATHY EVALUATION
Hemoglobin Other: 0 %
Hgb A2 Quant: 2.5 % (ref 2.2–3.2)
Hgb A: 97.2 % (ref 96.8–97.8)
Hgb F Quant: 0.3 % (ref 0.0–2.0)
Hgb S Quant: 0 %

## 2011-12-02 SURGERY — CERCLAGE, CERVIX, VAGINAL APPROACH
Anesthesia: Spinal | Site: Uterus | Wound class: Clean Contaminated

## 2011-12-02 MED ORDER — ONDANSETRON HCL 4 MG/2ML IJ SOLN
INTRAMUSCULAR | Status: DC | PRN
  Administered 2011-12-02: 4 mg via INTRAVENOUS

## 2011-12-02 MED ORDER — DOCUSATE SODIUM 100 MG PO CAPS: 100.0000 mg | ORAL_CAPSULE | Freq: Two times a day (BID) | ORAL | Status: AC | PRN

## 2011-12-02 MED ORDER — ONDANSETRON HCL 4 MG/2ML IJ SOLN: 4.0000 mg | Freq: Once | INTRAMUSCULAR | Status: DC | PRN

## 2011-12-02 MED ORDER — BUPIVACAINE HCL (PF) 0.5 % IJ SOLN
INTRAMUSCULAR | Status: DC | PRN
  Administered 2011-12-02: 30 mL

## 2011-12-02 MED ORDER — LACTATED RINGERS IV SOLN
INTRAVENOUS | Status: DC
  Administered 2011-12-02: 125 mL/h via INTRAVENOUS

## 2011-12-02 MED ORDER — ONDANSETRON HCL 4 MG/2ML IJ SOLN
INTRAMUSCULAR | Status: AC
  Filled 2011-12-02: qty 2

## 2011-12-02 MED ORDER — INDOMETHACIN 50 MG RE SUPP
100.0000 mg | Freq: Once | RECTAL | Status: DC
  Filled 2011-12-02: qty 2

## 2011-12-02 MED ORDER — BUPIVACAINE IN DEXTROSE 0.75-8.25 % IT SOLN
INTRATHECAL | Status: DC | PRN
  Administered 2011-12-02: 1 mL via INTRATHECAL

## 2011-12-02 MED ORDER — KETOROLAC TROMETHAMINE 30 MG/ML IJ SOLN: 15.0000 mg | Freq: Once | INTRAMUSCULAR | Status: DC | PRN

## 2011-12-02 MED ORDER — OXYCODONE-ACETAMINOPHEN 5-325 MG PO TABS: 1.0000 | ORAL_TABLET | Freq: Four times a day (QID) | ORAL | Status: DC | PRN

## 2011-12-02 MED ORDER — INDOMETHACIN 50 MG RE SUPP
RECTAL | Status: DC | PRN
  Administered 2011-12-02: 50 mg via RECTAL

## 2011-12-02 MED ORDER — OXYCODONE-ACETAMINOPHEN 5-325 MG PO TABS: 1.0000 | ORAL_TABLET | Freq: Four times a day (QID) | ORAL | Status: AC | PRN

## 2011-12-02 MED ORDER — DOCUSATE SODIUM 100 MG PO CAPS: 100.0000 mg | ORAL_CAPSULE | Freq: Two times a day (BID) | ORAL | Status: DC | PRN

## 2011-12-02 MED ORDER — PHENYLEPHRINE 40 MCG/ML (10ML) SYRINGE FOR IV PUSH (FOR BLOOD PRESSURE SUPPORT)
PREFILLED_SYRINGE | INTRAVENOUS | Status: AC
  Filled 2011-12-02: qty 5

## 2011-12-02 SURGICAL SUPPLY — 20 items
CATH ROBINSON RED A/P 16FR (CATHETERS) ×2 IMPLANT
CLOTH BEACON ORANGE TIMEOUT ST (SAFETY) ×2 IMPLANT
COUNTER NEEDLE 1200 MAGNETIC (NEEDLE) ×2 IMPLANT
ELECT REM PT RETURN 9FT ADLT (ELECTROSURGICAL)
ELECTRODE REM PT RTRN 9FT ADLT (ELECTROSURGICAL) IMPLANT
GLOVE BIO SURGEON STRL SZ7 (GLOVE) ×2 IMPLANT
GLOVE BIOGEL PI IND STRL 7.0 (GLOVE) ×2 IMPLANT
GLOVE BIOGEL PI INDICATOR 7.0 (GLOVE) ×2
GOWN PREVENTION PLUS LG XLONG (DISPOSABLE) ×4 IMPLANT
NEEDLE SPNL 22GX3.5 QUINCKE BK (NEEDLE) ×2 IMPLANT
PACK VAGINAL MINOR WOMEN LF (CUSTOM PROCEDURE TRAY) ×2 IMPLANT
PAD PREP 24X48 CUFFED NSTRL (MISCELLANEOUS) ×2 IMPLANT
PENCIL BUTTON HOLSTER BLD 10FT (ELECTRODE) IMPLANT
SUT PROLENE 1 CT 1 30 (SUTURE) ×2 IMPLANT
SYR BULB IRRIGATION 50ML (SYRINGE) ×2 IMPLANT
SYR CONTROL 10ML LL (SYRINGE) ×2 IMPLANT
TOWEL OR 17X24 6PK STRL BLUE (TOWEL DISPOSABLE) ×4 IMPLANT
TUBING NON-CON 1/4 X 20 CONN (TUBING) ×2 IMPLANT
WATER STERILE IRR 1000ML POUR (IV SOLUTION) ×2 IMPLANT
YANKAUER SUCT BULB TIP NO VENT (SUCTIONS) ×2 IMPLANT

## 2011-12-02 NOTE — H&P (Signed)
Preoperative History and Physical  Julie Lyons is a 30 y.o. [id number] at [redacted]w[redacted]d by LMP here for surgical management of cervical incompetence; she had painless cervical dilation and delivery at 16 weeks during her last pregnancy.   Proposed surgery: Transvaginal McDonald Cervical Cerclage  PMH:    Past Medical History  Diagnosis Date  . Ovarian cyst   . Hypertension     no medications-pt states b/p has been elevated for past 4 months at office visits  . Asthma     albuterol inhaler-not used in past 30 days    PSH:     Past Surgical History  Procedure Date  . Oophorectomy     left ovary and tube  . Tonsillectomy   . Vaginal delivery 1997, 2000. 2004    POb/GynH:   OB History    Grav Para Term Preterm Abortions TAB SAB Ect Mult Living   6 3 3  2 1 1   3       Medications: Current facility-administered medications:cefoTEtan (CEFOTAN) 2 g in dextrose 5 % 50 mL IVPB, 2 g, Intravenous, On Call to OR, Dona Klemann A Shailah Gibbins, MD;  indomethacin (INDOCIN) 50 MG suppository 100 mg, 100 mg, Rectal, Once, Pamula Luther A Jannat Rosemeyer, MD;  lactated ringers infusion, , Intravenous, Continuous, Tereso Newcomer, MD, Last Rate: 125 mL/hr at 12/02/11 1002, 125 mL/hr at 12/02/11 1002  Allergies: No Known Allergies  SH:   History  Substance Use Topics  . Smoking status: Former Smoker -- 4.0 packs/day for 10 years    Quit date: 10/02/2011  . Smokeless tobacco: Not on file  . Alcohol Use: No     occassionally    FH:    Family History  Problem Relation Age of Onset  . Hypertension Father     Review of Systems: Not indicated; not relevant to the procedure  PHYSICAL EXAM: Blood pressure 128/86, pulse 75, temperature 98.4 F (36.9 C), temperature source Oral, resp. rate 16, height 5\' 7"  (1.702 m), weight 94.348 kg (208 lb), last menstrual period 09/03/2011, SpO2 100.00%. FHT 152 General -  alert, well appearing, and in no distress Chest - clear to auscultation, no wheezes, rales or rhonchi, symmetric  air entry Abdomen - soft, nontender, nondistended, no masses or organomegaly Pelvic - examination not indicated Extremities - peripheral pulses normal, no pedal edema, no clubbing or cyanosis  Labs: Recent Results (from the past 336 hour(s))  CBC   Collection Time   11/27/11  9:52 AM      Component Value Range   WBC 6.1  4.0 - 10.5 (K/uL)   RBC 4.15  3.87 - 5.11 (MIL/uL)   Hemoglobin 12.8  12.0 - 15.0 (g/dL)   HCT 11.9  14.7 - 82.9 (%)   MCV 91.1  78.0 - 100.0 (fL)   MCH 30.8  26.0 - 34.0 (pg)   MCHC 33.9  30.0 - 36.0 (g/dL)   RDW 56.2  13.0 - 86.5 (%)   Platelets 242  150 - 400 (K/uL)  POCT URINALYSIS DIP (DEVICE)   Collection Time   11/28/11  8:28 AM      Component Value Range   Glucose, UA NEGATIVE  NEGATIVE (mg/dL)   Bilirubin Urine NEGATIVE  NEGATIVE    Ketones, ur NEGATIVE  NEGATIVE (mg/dL)   Specific Gravity, Urine 1.025  1.005 - 1.030    Hgb urine dipstick SMALL (*) NEGATIVE    pH 6.5  5.0 - 8.0    Protein, ur 30 (*) NEGATIVE (mg/dL)   Urobilinogen,  UA 0.2  0.0 - 1.0 (mg/dL)   Nitrite POSITIVE (*) NEGATIVE    Leukocytes, UA LARGE (*) NEGATIVE   CULTURE, OB URINE   Collection Time   11/28/11  9:46 AM      Component Value Range   Culture CITROBACTER KOSERI     Colony Count >=100,000 COLONIES/ML     Organism ID, Bacteria CITROBACTER KOSERI    PROTEIN, URINE, 24 HOUR   Collection Time   11/28/11 10:03 AM      Component Value Range   Protein, 24H Urine 273 (*) 50 - 100 (mg/day)  CREATININE CLEARANCE, URINE, 24 HOUR   Collection Time   11/28/11 10:03 AM      Component Value Range   Creatinine 0.66  0.50 - 1.10 (mg/dL)   Creatinine, Urine 213.0     Creatinine, 24H Ur 1450  700 - 1800 (mg/day)   Creatinine Clearance 153 (*) 75 - 115 (mL/min)  HIV ANTIBODY (ROUTINE TESTING)   Collection Time   11/28/11 10:17 AM      Component Value Range   HIV NON REACTIVE  NON REACTIVE   OBSTETRIC PANEL   Collection Time   11/28/11 10:17 AM      Component Value Range    WBC 5.7  4.0 - 10.5 (K/uL)   RBC 4.31  3.87 - 5.11 (MIL/uL)   Hemoglobin 13.2  12.0 - 15.0 (g/dL)   HCT 86.5  78.4 - 69.6 (%)   MCV 90.5  78.0 - 100.0 (fL)   MCH 30.6  26.0 - 34.0 (pg)   MCHC 33.8  30.0 - 36.0 (g/dL)   RDW 29.5  28.4 - 13.2 (%)   Platelets 262  150 - 400 (K/uL)   Neutrophils Relative 63  43 - 77 (%)   Neutro Abs 3.6  1.7 - 7.7 (K/uL)   Lymphocytes Relative 27  12 - 46 (%)   Lymphs Abs 1.6  0.7 - 4.0 (K/uL)   Monocytes Relative 7  3 - 12 (%)   Monocytes Absolute 0.4  0.1 - 1.0 (K/uL)   Eosinophils Relative 2  0 - 5 (%)   Eosinophils Absolute 0.1  0.0 - 0.7 (K/uL)   Basophils Relative 0  0 - 1 (%)   Basophils Absolute 0.0  0.0 - 0.1 (K/uL)   Smear Review Criteria for review not met     Hepatitis B Surface Ag NEGATIVE  NEGATIVE    RPR NON REAC  NON REAC    Rubella 52.3 (*)    ABO Grouping O     Rh Type POS     Antibody Screen NEG  NEGATIVE   HEMOGLOBINOPATHY EVALUATION   Collection Time   11/28/11 10:17 AM      Component Value Range   Hgb A    96.8 - 97.8 (%)   Hgb A2 Quant    2.2 - 3.2 (%)   Hgb F Quant    0.0 - 2.0 (%)   Hgb S Quant    0.0 (%)   Hemoglobin Other    0.0 (%)    Imaging Studies: No results found.  Assessment: Patient Active Problem List  Diagnoses  . Cervical incompetence with SAB at 16 weeks (silent dilation and delivery)  . UTI in pregnancy, antepartum  . Benign essential hypertension antepartum  . Asthma    Plan: The risks of surgery were discussed in detail with the patient including but not limited to: bleeding; infection which may require antibiotic therapy; injury to cervix,  vagina other surrounding organs; risk of ruptured membranes and/or preterm delivery and other postoperative or anesthesia complications.  Written informed consent was obtained. To OR when ready for transvaginal cerclage placement.   Jaynie Collins, M.D. 12/02/2011 10:10 AM

## 2011-12-02 NOTE — Anesthesia Preprocedure Evaluation (Signed)
Anesthesia Evaluation  Patient identified by MRN, date of birth, ID band Patient awake    Reviewed: Allergy & Precautions, H&P , NPO status , Patient's Chart, lab work & pertinent test results  Airway Mallampati: II TM Distance: >3 FB Neck ROM: full    Dental No notable dental hx.    Pulmonary  clear to auscultation  Pulmonary exam normal       Cardiovascular neg cardio ROS     Neuro/Psych Negative Neurological ROS  Negative Psych ROS   GI/Hepatic negative GI ROS, Neg liver ROS,   Endo/Other  Negative Endocrine ROS  Renal/GU negative Renal ROS  Genitourinary negative   Musculoskeletal negative musculoskeletal ROS (+)   Abdominal Normal abdominal exam  (+)   Peds negative pediatric ROS (+)  Hematology negative hematology ROS (+)   Anesthesia Other Findings   Reproductive/Obstetrics (+) Pregnancy                           Anesthesia Physical Anesthesia Plan  ASA: II  Anesthesia Plan: Spinal   Post-op Pain Management:    Induction:   Airway Management Planned:   Additional Equipment:   Intra-op Plan:   Post-operative Plan:   Informed Consent: I have reviewed the patients History and Physical, chart, labs and discussed the procedure including the risks, benefits and alternatives for the proposed anesthesia with the patient or authorized representative who has indicated his/her understanding and acceptance.     Plan Discussed with: CRNA  Anesthesia Plan Comments:         Anesthesia Quick Evaluation

## 2011-12-02 NOTE — Anesthesia Procedure Notes (Signed)
Spinal  Patient location during procedure: OR Start time: 12/02/2011 10:47 AM End time: 12/02/2011 10:50 AM Staffing Anesthesiologist: Sandrea Hughs Performed by: anesthesiologist  Preanesthetic Checklist Completed: patient identified, site marked, surgical consent, pre-op evaluation, timeout performed, IV checked, risks and benefits discussed and monitors and equipment checked Spinal Block Patient position: sitting Prep: DuraPrep Patient monitoring: heart rate, cardiac monitor, continuous pulse ox and blood pressure Approach: midline Location: L3-4 Injection technique: single-shot Needle Needle type: Sprotte  Needle gauge: 24 G Needle length: 9 cm Needle insertion depth: 7 cm Assessment Sensory level: T12

## 2011-12-02 NOTE — Transfer of Care (Signed)
Immediate Anesthesia Transfer of Care Note  Patient: Julie Lyons  Procedure(s) Performed:  CERCLAGE CERVICAL  Patient Location: PACU  Anesthesia Type: Spinal  Level of Consciousness: awake, alert  and oriented  Airway & Oxygen Therapy: Patient Spontanous Breathing  Post-op Assessment: Report given to PACU RN and Post -op Vital signs reviewed and stable  Post vital signs: Reviewed and stable  Complications: No apparent anesthesia complications

## 2011-12-02 NOTE — Op Note (Signed)
Julie Lyons   PROCEDURE DATE: 12/02/2011  PREOPERATIVE DIAGNOSIS: Intrauterine pregnancy at [redacted]w[redacted]d, history of cervical incompetence   POSTOPERATIVE DIAGNOSIS: The same PROCEDURE: Transvaginal McDonald Cervical Cerclage Placement SURGEON:  Dr. Jaynie Collins  INDICATIONS: 30 y.o. [id number] at [redacted]w[redacted]d with history of cervical incompetence, here for cerclage placement.   The risks of surgery were discussed in detail with the patient including but not limited to: bleeding; infection which may require antibiotic therapy; injury to cervix, vagina other surrounding organs; risk of ruptured membranes and/or preterm delivery and other postoperative or anesthesia complications.  Written informed consent was obtained.    FINDINGS:  About 2 cm palpable cervical length in the vagina, closed cervix, suture knot placed anteriorly.  ANESTHESIA:  Spinal, paracervical block. INTRAVENOUS FLUIDS: 1400  ml ESTIMATED BLOOD LOSS: 10 ml COMPLICATIONS: None immediate  PROCEDURE IN DETAIL:  The patient received intravenous antibiotics and had sequential compression devices applied to her lower extremities while in the preoperative area.  Reassuring fetal heart rate was also obtained using a doppler. She was then taken to the operating room where spinal anesthesia was administered and was found to be adequate.  She was placed in the dorsal lithotomy, and was prepped and draped in a sterile manner. Her bladder was catheterized for an unmeasured amount of clear, yellow urine.  Indomethacin 100 mg rectal suppository was placed.  After an adequate timeout was performed, a vaginal speculum was then placed in the patient's vagina and a single tooth tenaculum was applied to the anterior lip of the cervix.  A paracervical block using 0.5% Marcaine was administered.  The anterior and posterior lips of the cervix was grasped with ring forceps. A curved needle loaded with a number 1 Prolene suture was inserted at 12 o'clock, as high as  possible at the junction of the rugated vaginal epithelium and the smooth cervix, at least 2 cm above the external os.  Four bites are taken circumferentially around the entire cervix in a purse-string fashion, each bite should be deep enough to extend at least midway into the cervical stroma, but not into the endocervical canal. The two ends of the suture were then tied securely anteriorly and cut, leaving the ends long enough to grasp with a clamp when it is time to remove it. There was minimal bleeding noted and the ring forceps were removed with good hemostasis noted.  All instruments were removed from the patient's vagina.  Instrument, needle and sponge counts were correct x 2. The patient tolerated the procedure well, and was taken to the recovery area awake and in stable condition. Reassuring fetal heart rate was also obtained using a doppler in the recovery area.  The patient will be discharged to home as per PACU criteria.  Routine postoperative instructions given.  She was prescribed Percocet and Colace.  She will follow up in the clinic on 12/12/11 for postoperative evaluation and ongoing prenatal care.

## 2011-12-02 NOTE — Anesthesia Postprocedure Evaluation (Signed)
Anesthesia Post Note  Patient: Julie Lyons  Procedure(s) Performed:  CERCLAGE CERVICAL  Anesthesia type: Spinal  Patient location: PACU  Post pain: Pain level controlled  Post assessment: Post-op Vital signs reviewed  Last Vitals:  Filed Vitals:   12/02/11 1130  BP: 108/81  Pulse: 68  Temp: 36.8 C  Resp: 16    Post vital signs: Reviewed  Level of consciousness: awake  Complications: No apparent anesthesia complications

## 2011-12-03 ENCOUNTER — Encounter (HOSPITAL_COMMUNITY): Payer: Self-pay | Admitting: Obstetrics & Gynecology

## 2011-12-04 ENCOUNTER — Ambulatory Visit (HOSPITAL_COMMUNITY)
Admission: RE | Admit: 2011-12-04 | Discharge: 2011-12-04 | Disposition: A | Discharge status: Home / Self Care | Payer: Medicaid Other | Source: Ambulatory Visit | Attending: Obstetrics and Gynecology | Admitting: Obstetrics and Gynecology

## 2011-12-04 DIAGNOSIS — O3510X0 Maternal care for (suspected) chromosomal abnormality in fetus, unspecified, not applicable or unspecified: Secondary | ICD-10-CM | POA: Insufficient documentation

## 2011-12-04 DIAGNOSIS — Z3689 Encounter for other specified antenatal screening: Secondary | ICD-10-CM | POA: Insufficient documentation

## 2011-12-04 DIAGNOSIS — O351XX0 Maternal care for (suspected) chromosomal abnormality in fetus, not applicable or unspecified: Secondary | ICD-10-CM | POA: Insufficient documentation

## 2011-12-04 DIAGNOSIS — O0990 Supervision of high risk pregnancy, unspecified, unspecified trimester: Secondary | ICD-10-CM

## 2011-12-11 ENCOUNTER — Other Ambulatory Visit: Payer: Self-pay

## 2011-12-12 ENCOUNTER — Ambulatory Visit (INDEPENDENT_AMBULATORY_CARE_PROVIDER_SITE_OTHER): Payer: Self-pay | Admitting: Obstetrics & Gynecology

## 2011-12-12 DIAGNOSIS — N883 Incompetence of cervix uteri: Secondary | ICD-10-CM

## 2011-12-12 DIAGNOSIS — O2390 Unspecified genitourinary tract infection in pregnancy, unspecified trimester: Secondary | ICD-10-CM

## 2011-12-12 DIAGNOSIS — O10019 Pre-existing essential hypertension complicating pregnancy, unspecified trimester: Secondary | ICD-10-CM

## 2011-12-12 DIAGNOSIS — N390 Urinary tract infection, site not specified: Secondary | ICD-10-CM

## 2011-12-12 DIAGNOSIS — O3430 Maternal care for cervical incompetence, unspecified trimester: Secondary | ICD-10-CM

## 2011-12-12 DIAGNOSIS — O2340 Unspecified infection of urinary tract in pregnancy, unspecified trimester: Secondary | ICD-10-CM

## 2011-12-12 LAB — POCT URINALYSIS DIP (DEVICE)
Nitrite: POSITIVE — AB
Protein, ur: 30 mg/dL — AB
pH: 5.5 (ref 5.0–8.0)

## 2011-12-12 NOTE — Patient Instructions (Signed)
Hand Washing Staying healthy is important to you and your entire family. Follow these easy, low-cost steps to help stop many infectious diseases before they happen. HOW TO [facility]  Wet your hands and apply liquid, bar, or powder soap.   Rub hands together vigorously to make a lather and scrub all surfaces. Be sure to clean between the fingers and around the nails.   Continue for 20 seconds! It takes that long for the soap and scrubbing action to dislodge and remove stubborn germs.   Rinse hands well under running water.   Dry your hands using a paper towel or air dryer.   If possible, use your paper towel or elbow to turn off the faucet. This will help avoid re-exposure to germs on the handle.  WHEN TO [facility] YOUR HANDS  Before and after eating.   Before, during, and after handling or preparing food.   After contact with blood or body fluids (like vomit, nasal secretions, or saliva). This means washing after you blow your nose!   Before and after changing a diaper.   After you use the bathroom.   After handling animals, their toys, leashes, or waste.   After touching something that could be contaminated (such as a trash can, cleaning cloth, drain, or soil).   Before and after taking care of (dressing) a wound, giving medicine, or inserting contact lenses.   More often when someone in your home is sick.   Whenever your hands become soiled.  If soap and water are not available, use an alcohol-based wipe or hand gel. Keeping your hands clean is one of the best ways to keep from getting sick and spreading illnesses. Cleaning your hands gets rid of germs you pick up:  From other people.   From the surfaces you touch.   From the animals you come in contact with.  Document Released: 08/06/2005 Document Revised: 08/28/2011 Document Reviewed: 01/11/2009 [facility] Patient Information 2012 [place], [place].

## 2011-12-12 NOTE — Progress Notes (Signed)
Pelvic pressure and pain. Pulse 96. Clear to white vaginal discharge.

## 2011-12-12 NOTE — Progress Notes (Signed)
Normal NT, and first screen.  Offered MSAFP, patient agrees.  Will draw next visit. Will give note for work given cervical incompetence. UA also showed +nitrites, small LE, 30 protein.  Will send for culture. Asymptomatic. No other complaints or concerns.  Preterm labor precautions reviewed.  Will schedule cervical length ultrasound in 2 weeks, anatomy scan at 18 weeks.

## 2011-12-12 NOTE — Progress Notes (Signed)
U/S scheduled on 12/26/11 at 1045 am for cervical length. Anatomy U/S scheduled 01/09/12 at 930 am.

## 2011-12-12 NOTE — Progress Notes (Deleted)
UA also showed +nitrites, small LE, 30 protein.  Will send for culture. Asymptomatic. No other complaints or concerns.  Preterm labor precautions reviewed.  Will schedule cervical length ultrasound in 2 weeks, anatomy scan at 18 weeks.

## 2011-12-26 ENCOUNTER — Ambulatory Visit (HOSPITAL_COMMUNITY)
Admission: RE | Admit: 2011-12-26 | Discharge: 2011-12-26 | Disposition: A | Discharge status: Home / Self Care | Payer: Medicaid Other | Source: Ambulatory Visit | Attending: Obstetrics & Gynecology | Admitting: Obstetrics & Gynecology

## 2011-12-26 ENCOUNTER — Ambulatory Visit (INDEPENDENT_AMBULATORY_CARE_PROVIDER_SITE_OTHER): Payer: Medicaid Other | Admitting: Family Medicine

## 2011-12-26 ENCOUNTER — Encounter: Payer: Self-pay | Admitting: Family Medicine

## 2011-12-26 VITALS — BP 128/79 | Temp 97.6°F | Wt 207.8 lb

## 2011-12-26 DIAGNOSIS — O2390 Unspecified genitourinary tract infection in pregnancy, unspecified trimester: Secondary | ICD-10-CM

## 2011-12-26 DIAGNOSIS — O0990 Supervision of high risk pregnancy, unspecified, unspecified trimester: Secondary | ICD-10-CM | POA: Insufficient documentation

## 2011-12-26 DIAGNOSIS — O2340 Unspecified infection of urinary tract in pregnancy, unspecified trimester: Secondary | ICD-10-CM

## 2011-12-26 DIAGNOSIS — O3430 Maternal care for cervical incompetence, unspecified trimester: Secondary | ICD-10-CM

## 2011-12-26 DIAGNOSIS — A6000 Herpesviral infection of urogenital system, unspecified: Secondary | ICD-10-CM | POA: Insufficient documentation

## 2011-12-26 DIAGNOSIS — N883 Incompetence of cervix uteri: Secondary | ICD-10-CM

## 2011-12-26 DIAGNOSIS — Z3689 Encounter for other specified antenatal screening: Secondary | ICD-10-CM | POA: Insufficient documentation

## 2011-12-26 DIAGNOSIS — O10019 Pre-existing essential hypertension complicating pregnancy, unspecified trimester: Secondary | ICD-10-CM

## 2011-12-26 DIAGNOSIS — N390 Urinary tract infection, site not specified: Secondary | ICD-10-CM

## 2011-12-26 LAB — POCT URINALYSIS DIP (DEVICE)
Ketones, ur: NEGATIVE mg/dL
Protein, ur: >=300 mg/dL — AB
Specific Gravity, Urine: >=1.03 (ref 1.005–1.030)
Urobilinogen, UA: 0.2 mg/dL (ref 0.0–1.0)

## 2011-12-26 MED ORDER — NITROFURANTOIN MACROCRYSTAL 50 MG PO CAPS: 50.0000 mg | ORAL_CAPSULE | Freq: Every day | ORAL | Status: AC

## 2011-12-26 MED ORDER — SULFAMETHOXAZOLE-TRIMETHOPRIM 800-160 MG PO TABS: 1.0000 | ORAL_TABLET | Freq: Two times a day (BID) | ORAL | Status: AC

## 2011-12-26 NOTE — Patient Instructions (Signed)
AFP Maternal This is a routine screen (tests) used to check for fetal abnormalities such as Down syndrome and neural tube defects. Down Syndrome is a chromosomal abnormality, sometimes called Trisomy 21. Neural tube defects are serious birth defects. The brain, spinal cord, or their coverings do not develop completely. Women should be tested in the 15th to 20th week of pregnancy. The msAFP screen involves three or four tests that measure substances found in the blood that make the testing better. During development, AFP levels in fetal blood and amniotic fluid rise until about 12 weeks. The levels then gradually fall until birth. AFP is a protein produce by fetal tissue. AFP crosses the placenta and appears in the maternal blood. A baby with an open neural tube defect has an opening in its spine, head, or abdominal wall that allows higher-than-usual amounts of AFP to pass into the mother's blood. If a screen is positive, more tests are needed to make a diagnosis. These include ultrasound and perhaps amniocentesis (checking the fluid that surrounds the baby). These tests are used to help women and their caregivers make decisions about the management of their pregnancies. In pregnancies where the fetus is carrying the chromosomal defect that results in Down syndrome, the levels of AFP and unconjugated estriol tend to be low and hCG and inhibin A levels high.  PREPARATION FOR TEST Blood is drawn from a vein in your arm usually between the 15th and 20th weeks of pregnancy. Four different tests on your blood are done. These are AFP, hCG, unconjugated estriol, and inhibin A. The combination of tests produces a more accurate result. NORMAL FINDINGS   Adult: less than 40ng/mL or less than 40 mg/L (SI units)   Child younger than1 year: less than 30 ng/mL  Ranges are stratified by weeks of gestation and vary among laboratories. Ranges for normal findings may vary among different laboratories and hospitals. You  should always check with your doctor after having lab work or other tests done to discuss the meaning of your test results and whether your values are considered within normal limits. MEANING OF TEST  These are screening tests. Not all fetal abnormalities will give positive test results. Of all women who have positive AFP screening results, only a very small number of them have babies who actually have a neural tube defect or chromosomal abnormality. Your caregiver will go over the test results with you and discuss the importance and meaning of your results, as well as treatment options and the need for additional tests if necessary. OBTAINING THE TEST RESULTS It is your responsibility to obtain your test results. Ask the lab or department performing the test when and how you will get your results. Document Released: 01/07/2005 Document Revised: 08/28/2011 Document Reviewed: 11/19/2008 [employer] Patient Information 2012 [employer]. 

## 2011-12-26 NOTE — Progress Notes (Signed)
>   300 urine.  Still having citrobacter UTI with +nitrites and large leukocyte esterace.  Has had two courses of keflex, without resolution.  Will treat with bactrim and place on nitrofurantoin qhs for prophylaxis.   No other complaints.  No vaginal d/c, vag bleeding, contractions, leaking fluid.   AFP today.  FTC in 2 weeks.

## 2011-12-31 NOTE — L&D Delivery Note (Addendum)
Delivery Note At 12:23 AM a viable female was delivered via  (Presentation: L occiput anterior).  APGAR: 9,10 ; weight- n/a Placenta status: intact. Cord: 3 vessel  with the following complications: none .  Cord pH: n/a  Anesthesia:  epidural Episiotomy: none Lacerations: none Suture Repair: n/a Est. Blood Loss (mL): 200  Mom to postpartum.  Baby to nursery-stable.    Andrena Mews, DO [facility] Resident - PGY-1 05/22/2012 12:41 AM  I attended and precepted the delivery of this patient.  I agree with the above note.  Levie Heritage, DO 05/22/2012 12:57 AM

## 2012-01-06 ENCOUNTER — Encounter: Payer: Self-pay | Admitting: *Deleted

## 2012-01-09 ENCOUNTER — Ambulatory Visit (HOSPITAL_COMMUNITY)
Admission: RE | Admit: 2012-01-09 | Discharge: 2012-01-09 | Disposition: A | Discharge status: Home / Self Care | Payer: Medicaid Other | Source: Ambulatory Visit | Attending: Obstetrics & Gynecology | Admitting: Obstetrics & Gynecology

## 2012-01-09 ENCOUNTER — Ambulatory Visit (INDEPENDENT_AMBULATORY_CARE_PROVIDER_SITE_OTHER): Payer: Medicaid Other | Admitting: Family Medicine

## 2012-01-09 DIAGNOSIS — O1210 Gestational proteinuria, unspecified trimester: Secondary | ICD-10-CM | POA: Insufficient documentation

## 2012-01-09 DIAGNOSIS — O0990 Supervision of high risk pregnancy, unspecified, unspecified trimester: Secondary | ICD-10-CM

## 2012-01-09 DIAGNOSIS — O10019 Pre-existing essential hypertension complicating pregnancy, unspecified trimester: Secondary | ICD-10-CM

## 2012-01-09 DIAGNOSIS — O358XX0 Maternal care for other (suspected) fetal abnormality and damage, not applicable or unspecified: Secondary | ICD-10-CM | POA: Insufficient documentation

## 2012-01-09 DIAGNOSIS — N883 Incompetence of cervix uteri: Secondary | ICD-10-CM

## 2012-01-09 DIAGNOSIS — Z363 Encounter for antenatal screening for malformations: Secondary | ICD-10-CM | POA: Insufficient documentation

## 2012-01-09 DIAGNOSIS — O2390 Unspecified genitourinary tract infection in pregnancy, unspecified trimester: Secondary | ICD-10-CM

## 2012-01-09 DIAGNOSIS — O2340 Unspecified infection of urinary tract in pregnancy, unspecified trimester: Secondary | ICD-10-CM

## 2012-01-09 DIAGNOSIS — R809 Proteinuria, unspecified: Secondary | ICD-10-CM

## 2012-01-09 DIAGNOSIS — O26839 Pregnancy related renal disease, unspecified trimester: Secondary | ICD-10-CM

## 2012-01-09 DIAGNOSIS — Z1389 Encounter for screening for other disorder: Secondary | ICD-10-CM | POA: Insufficient documentation

## 2012-01-09 DIAGNOSIS — N390 Urinary tract infection, site not specified: Secondary | ICD-10-CM

## 2012-01-09 DIAGNOSIS — O3430 Maternal care for cervical incompetence, unspecified trimester: Secondary | ICD-10-CM | POA: Insufficient documentation

## 2012-01-09 HISTORY — DX: Gestational proteinuria, unspecified trimester: O12.10

## 2012-01-09 LAB — POCT URINALYSIS DIP (DEVICE)
Bilirubin Urine: NEGATIVE
Glucose, UA: NEGATIVE mg/dL
Ketones, ur: NEGATIVE mg/dL
Leukocytes, UA: NEGATIVE
Nitrite: NEGATIVE
Protein, ur: 100 mg/dL — AB
Specific Gravity, Urine: >=1.03 (ref 1.005–1.030)
Urobilinogen, UA: 0.2 mg/dL (ref 0.0–1.0)
pH: 6 (ref 5.0–8.0)

## 2012-01-09 NOTE — Progress Notes (Signed)
Still with proteinuria--will culture but if not, repeat 24 hour urine.

## 2012-01-09 NOTE — Patient Instructions (Addendum)
Pregnancy - Second Trimester The second trimester of pregnancy (3 to 6 months) is a period of rapid growth for you and your baby. At the end of the sixth month, your baby is about 9 inches long and weighs 1 1/2 pounds. You will begin to feel the baby move between 18 and 20 weeks of the pregnancy. This is called quickening. Weight gain is faster. A clear fluid (colostrum) may leak out of your breasts. You may feel small contractions of the womb (uterus). This is known as false labor or Braxton-Hicks contractions. This is like a practice for labor when the baby is ready to be born. Usually, the problems with morning sickness have usually passed by the end of your first trimester. Some women develop small dark blotches (called cholasma, mask of pregnancy) on their face that usually goes away after the baby is born. Exposure to the sun makes the blotches worse. Acne may also develop in some pregnant women and pregnant women who have acne, may find that it goes away. PRENATAL EXAMS  Blood work may continue to be done during prenatal exams. These tests are done to check on your health and the probable health of your baby. Blood work is used to follow your blood levels (hemoglobin). Anemia (low hemoglobin) is common during pregnancy. Iron and vitamins are given to help prevent this. You will also be checked for diabetes between 24 and 28 weeks of the pregnancy. Some of the previous blood tests may be repeated.   The size of the uterus is measured during each visit. This is to make sure that the baby is continuing to grow properly according to the dates of the pregnancy.   Your blood pressure is checked every prenatal visit. This is to make sure you are not getting toxemia.   Your urine is checked to make sure you do not have an infection, diabetes or protein in the urine.   Your weight is checked often to make sure gains are happening at the suggested rate. This is to ensure that both you and your baby are  growing normally.   Sometimes, an ultrasound is performed to confirm the proper growth and development of the baby. This is a test which bounces harmless sound waves off the baby so your caregiver can more accurately determine due dates.  Sometimes, a specialized test is done on the amniotic fluid surrounding the baby. This test is called an amniocentesis. The amniotic fluid is obtained by sticking a needle into the belly (abdomen). This is done to check the chromosomes in instances where there is a concern about possible genetic problems with the baby. It is also sometimes done near the end of pregnancy if an early delivery is required. In this case, it is done to help make sure the baby's lungs are mature enough for the baby to live outside of the womb. CHANGES OCCURING IN THE SECOND TRIMESTER OF PREGNANCY Your body goes through many changes during pregnancy. They vary from person to person. Talk to your caregiver about changes you notice that you are concerned about.  During the second trimester, you will likely have an increase in your appetite. It is normal to have cravings for certain foods. This varies from person to person and pregnancy to pregnancy.   Your lower abdomen will begin to bulge.   You may have to urinate more often because the uterus and baby are pressing on your bladder. It is also common to get more bladder infections during pregnancy (  pain with urination). You can help this by drinking lots of fluids and emptying your bladder before and after intercourse.   You may begin to get stretch marks on your hips, abdomen, and breasts. These are normal changes in the body during pregnancy. There are no exercises or medications to take that prevent this change.   You may begin to develop swollen and bulging veins (varicose veins) in your legs. Wearing support hose, elevating your feet for 15 minutes, 3 to 4 times a day and limiting salt in your diet helps lessen the problem.    Heartburn may develop as the uterus grows and pushes up against the stomach. Antacids recommended by your caregiver helps with this problem. Also, eating smaller meals 4 to 5 times a day helps.   Constipation can be treated with a stool softener or adding bulk to your diet. Drinking lots of fluids, vegetables, fruits, and whole grains are helpful.   Exercising is also helpful. If you have been very active up until your pregnancy, most of these activities can be continued during your pregnancy. If you have been less active, it is helpful to start an exercise program such as walking.   Hemorrhoids (varicose veins in the rectum) may develop at the end of the second trimester. Warm sitz baths and hemorrhoid cream recommended by your caregiver helps hemorrhoid problems.   Backaches may develop during this time of your pregnancy. Avoid heavy lifting, wear low heal shoes and practice good posture to help with backache problems.   Some pregnant women develop tingling and numbness of their hand and fingers because of swelling and tightening of ligaments in the wrist (carpel tunnel syndrome). This goes away after the baby is born.   As your breasts enlarge, you may have to get a bigger bra. Get a comfortable, cotton, support bra. Do not get a nursing bra until the last month of the pregnancy if you will be nursing the baby.   You may get a dark line from your belly button to the pubic area called the linea nigra.   You may develop rosy cheeks because of increase blood flow to the face.   You may develop spider looking lines of the face, neck, arms and chest. These go away after the baby is born.  HOME CARE INSTRUCTIONS   It is extremely important to avoid all smoking, herbs, alcohol, and unprescribed drugs during your pregnancy. These chemicals affect the formation and growth of the baby. Avoid these chemicals throughout the pregnancy to ensure the delivery of a healthy infant.   Most of your home  care instructions are the same as suggested for the first trimester of your pregnancy. Keep your caregiver's appointments. Follow your caregiver's instructions regarding medication use, exercise and diet.   During pregnancy, you are providing food for you and your baby. Continue to eat regular, well-balanced meals. Choose foods such as meat, fish, milk and other low fat dairy products, vegetables, fruits, and whole-grain breads and cereals. Your caregiver will tell you of the ideal weight gain.   A physical sexual relationship may be continued up until near the end of pregnancy if there are no other problems. Problems could include early (premature) leaking of amniotic fluid from the membranes, vaginal bleeding, abdominal pain, or other medical or pregnancy problems.   Exercise regularly if there are no restrictions. Check with your caregiver if you are unsure of the safety of some of your exercises. The greatest weight gain will occur in the   last 2 trimesters of pregnancy. Exercise will help you:   Control your weight.   Get you in shape for labor and delivery.   Lose weight after you have the baby.   Wear a good support or jogging bra for breast tenderness during pregnancy. This may help if worn during sleep. Pads or tissues may be used in the bra if you are leaking colostrum.   Do not use hot tubs, steam rooms or saunas throughout the pregnancy.   Wear your seat belt at all times when driving. This protects you and your baby if you are in an accident.   Avoid raw meat, uncooked cheese, cat litter boxes and soil used by cats. These carry germs that can cause birth defects in the baby.   The second trimester is also a good time to visit your dentist for your dental health if this has not been done yet. Getting your teeth cleaned is OK. Use a soft toothbrush. Brush gently during pregnancy.   It is easier to loose urine during pregnancy. Tightening up and strengthening the pelvic muscles will  help with this problem. Practice stopping your urination while you are going to the bathroom. These are the same muscles you need to strengthen. It is also the muscles you would use as if you were trying to stop from passing gas. You can practice tightening these muscles up 10 times a set and repeating this about 3 times per day. Once you know what muscles to tighten up, do not perform these exercises during urination. It is more likely to contribute to an infection by backing up the urine.   Ask for help if you have financial, counseling or nutritional needs during pregnancy. Your caregiver will be able to offer counseling for these needs as well as refer you for other special needs.   Your skin may become oily. If so, wash your face with mild soap, use non-greasy moisturizer and oil or cream based makeup.  MEDICATIONS AND DRUG USE IN PREGNANCY  Take prenatal vitamins as directed. The vitamin should contain 1 milligram of folic acid. Keep all vitamins out of reach of children. Only a couple vitamins or tablets containing iron may be fatal to a baby or young child when ingested.   Avoid use of all medications, including herbs, over-the-counter medications, not prescribed or suggested by your caregiver. Only take over-the-counter or prescription medicines for pain, discomfort, or fever as directed by your caregiver. Do not use aspirin.   Let your caregiver also know about herbs you may be using.   Alcohol is related to a number of birth defects. This includes fetal alcohol syndrome. All alcohol, in any form, should be avoided completely. Smoking will cause low birth rate and premature babies.   Street or illegal drugs are very harmful to the baby. They are absolutely forbidden. A baby born to an addicted mother will be addicted at birth. The baby will go through the same withdrawal an adult does.  SEEK MEDICAL CARE IF:  You have any concerns or worries during your pregnancy. It is better to call with  your questions if you feel they cannot wait, rather than worry about them. SEEK IMMEDIATE MEDICAL CARE IF:   An unexplained oral temperature above 102 F (38.9 C) develops, or as your caregiver suggests.   You have leaking of fluid from the vagina (birth canal). If leaking membranes are suspected, take your temperature and tell your caregiver of this when you call.   There   is vaginal spotting, bleeding, or passing clots. Tell your caregiver of the amount and how many pads are used. Light spotting in pregnancy is common, especially following intercourse.   You develop a bad smelling vaginal discharge with a change in the color from clear to white.   You continue to feel sick to your stomach (nauseated) and have no relief from remedies suggested. You vomit blood or coffee ground-like materials.   You lose more than 2 pounds of weight or gain more than 2 pounds of weight over 1 week, or as suggested by your caregiver.   You notice swelling of your face, hands, feet, or legs.   You get exposed to German measles and have never had them.   You are exposed to fifth disease or chickenpox.   You develop belly (abdominal) pain. Round ligament discomfort is a common non-cancerous (benign) cause of abdominal pain in pregnancy. Your caregiver still must evaluate you.   You develop a bad headache that does not go away.   You develop fever, diarrhea, pain with urination, or shortness of breath.   You develop visual problems, blurry, or double vision.   You fall or are in a car accident or any kind of trauma.   There is mental or physical violence at home.  Document Released: 12/10/2001 Document Revised: 08/28/2011 Document Reviewed: 06/14/2009 [employer] Patient Information 2012 [employer]. Birth Control Choices Birth control is the use of any practices, methods, or devices to prevent pregnancy from happening in a sexually active woman.  Below are some birth control choices to help avoid  pregnancy.  Not having sex (abstinence) is the surest form of birth control. This requires self-control. There is no risk of acquiring a sexually transmitted disease (STD), including acquired immunodeficiency syndrome (AIDS).   Periodic abstinence requires self-control during certain times of the month.   Calendar method, timing your menstrual periods from month to month.   Ovulation method is avoiding sexual intercourse around the time you produce an egg (ovulate).   Symptotherm method is avoiding sexual intercourse at the time of ovulation, using a thermometer and ovulation symptoms.   Post ovulation method is the timing of sexual intercourse after you ovulated.  These methods do not protect against STDs, including AIDS.  Birth control pills (BCPs) contain estrogen and progesterone hormone. These medicines work by stopping the egg from forming in the ovary (ovulation). Birth control pills are prescribed by a caregiver who will ask you questions about the risks of taking BCPs. Birth control pills do not protect against STDs, including AIDS.   "Minipill" birth control pills have only the progesterone hormone. They are taken every day of each month and must be prescribed by your caregiver. They do not protect against STDs, including AIDS.   Emergency contraception is often call the "morning after" pill. This pill can be taken right after sex or up to five days after sex if you think your birth control failed, you failed to use contraception, or you were forced to have sex. It is most effective the sooner you take the pills after having sexual intercourse. Do not use emergency contraception as your only form of birth control. Emergency contraceptive pills are available without a prescription. Check with your pharmacist.   Condoms are a thin sheath of latex, synthetic material, or lambskin worn over the penis during sexual intercourse. They can have a spermicide in or on them when you buy them.  Latex condoms can prevent pregnancy and STDs. "Natural" or lambskin   condoms can prevent pregnancy but may not protect against STDs, including AIDS.   Female condoms are a soft, loose-fitting sheath that is put into the vagina before sexual intercourse. They can prevent pregnancy and STDs, including AIDS.   Sponge is a soft, circular piece of polyurethane foam with spermicide in it that is inserted into the vagina after wetting it and before sexual intercourse. It does not require a prescription from your caregiver. It does not protect against STDs, including AIDS.   Diaphragm is a soft, latex, dome-shaped barrier that must be fitted by a caregiver. It is inserted into the vagina, along with a spermicidal jelly. After the proper fitting for a diaphragm, always insert the diaphragm before intercourse. The diaphragm should be left in the vagina for 6 to 8 hours after intercourse. Removal and reinsertion with a spermicide is always necessary after any use. It does not protect against STDs, including AIDS.   Progesterone-only injections are given every 3 months to prevent pregnancy. These injections contain synthetic progesterone and no estrogen. This hormone stops the ovaries from releasing eggs. It also causes the cervical mucus to thicken and changes the uterine lining. This makes it harder for sperm to survive in the uterus. It does not protect against STDs, including AIDS.   Birth Control Patch contains hormones similar to those in birth control pills, so effectiveness, risks, and side effects are similar. It must be changed once a week and is prescribed by a caregiver. It is less effective in very overweight women. It does not protect against STDs, including AIDS.   Vaginal Ring contains hormones similar to those in birth control pills. It is left in place for 3 weeks, removed for 1 week, and then a new one is put back into the vagina. It comes with a timer to put in your purse to help you remember when  to take it out or put a new one in. A caregiver's examination and prescription is necessary, just like with birth control pills and the patch. It does not protect against STDs, including AIDS.   Estrogen plus progesterone injections are given every 28 to 30 days. They can be given in the upper arm, thigh, or buttocks. It does not protect against STDs, including AIDS.   Intrauterine device (IUD): copper T or progestin filled is a T-shaped device that is put in a woman's uterus during a menstrual period to prevent pregnancy. The copper T IUD can last 10 years, and the progestin IUD can last 5 years. The progestin IUD can also help control heavy menstrual periods. It does not protect against STDs, including AIDS. The copper T IUD can be used as emergency contraception if inserted within 5 days of having unprotected intercourse.   Cervical cap is a round, soft latex or plastic cup that fits over the cervix and must be fitted by a caregiver. You do not need to use a spermicide with it or remove and insert it every time you have sexual intercourse. It does not protect against STDs, including AIDS.   Spermicides are chemicals that kill or block sperm from entering the cervix and uterus. They come in the form of creams, jellies, suppositories, foam, or tablets, and they do not require a prescription. They are inserted into the vagina with an applicator before having sexual intercourse. This must be repeated every time you have sexual intercourse.   Withdrawal is using the method of the female withdrawing his penis from sexual intercourse before he has a climax   and deposits his sperm. It does not protect against STDs, including AIDS.   Female tubal ligation is when the woman's fallopian tubes are surgically sealed or tied to prevent the egg from traveling to the uterus. It does not protect against STDs, including AIDS.   Female sterilization is when the female has his tubes that carry sperm tied off (vasectomy) to  stop sperm from entering the vagina during sexual intercourse. It does not protect against STDs, including AIDS.  Regardless of which method of birth control you choose, it is still important that you use some form of protection against STDs. Document Released: 12/16/2005 Document Revised: 01/18/2011 Document Reviewed: 11/02/2009 [employer] Patient Information 2012 [employer]. Breastfeeding BENEFITS OF BREASTFEEDING For the baby  The first milk (colostrum) helps the baby's digestive system function better.   There are antibodies from the mother in the milk that help the baby fight off infections.   The baby has a lower incidence of asthma, allergies, and SIDS (sudden infant death syndrome).   The nutrients in breast milk are better than formulas for the baby and helps the baby's brain grow better.   Babies who breastfeed have less gas, colic, and constipation.  For the mother  Breastfeeding helps develop a very special bond between mother and baby.   It is more convenient, always available at the correct temperature and cheaper than formula feeding.   It burns calories in the mother and helps with losing weight that was gained during pregnancy.   It makes the uterus contract back down to normal size faster and slows bleeding following delivery.   Breastfeeding mothers have a lower risk of developing breast cancer.  NURSE FREQUENTLY  A healthy, full-term baby may breastfeed as often as every hour or space his or her feedings to every 3 hours.   How often to nurse will vary from baby to baby. Watch your baby for signs of hunger, not the clock.   Nurse as often as the baby requests, or when you feel the need to reduce the fullness of your breasts.   Awaken the baby if it has been 3 to 4 hours since the last feeding.   Frequent feeding will help the mother make more milk and will prevent problems like sore nipples and engorgement of the breasts.  BABY'S POSITION AT THE  BREAST  Whether lying down or sitting, be sure that the baby's tummy is facing your tummy.   Support the breast with 4 fingers underneath the breast and the thumb above. Make sure your fingers are well away from the nipple and baby's mouth.   Stroke the baby's lips and cheek closest to the breast gently with your finger or nipple.   When the baby's mouth is open wide enough, place all of your nipple and as much of the dark area around the nipple as possible into your baby's mouth.   Pull the baby in close so the tip of the nose and the baby's cheeks touch the breast during the feeding.  FEEDINGS  The length of each feeding varies from baby to baby and from feeding to feeding.   The baby must suck about 2 to 3 minutes for your milk to get to him or her. This is called a "let down." For this reason, allow the baby to feed on each breast as long as he or she wants. Your baby will end the feeding when he or she has received the right balance of nutrients.   To   break the suction, put your finger into the corner of the baby's mouth and slide it between his or her gums before removing your breast from his or her mouth. This will help prevent sore nipples.  REDUCING BREAST ENGORGEMENT  In the first week after your baby is born, you may experience signs of breast engorgement. When breasts are engorged, they feel heavy, warm, full, and may be tender to the touch. You can reduce engorgement if you:   Nurse frequently, every 2 to 3 hours. Mothers who breastfeed early and often have fewer problems with engorgement.   Place light ice packs on your breasts between feedings. This reduces swelling. Wrap the ice packs in a lightweight towel to protect your skin.   Apply moist hot packs to your breast for 5 to 10 minutes before each feeding. This increases circulation and helps the milk flow.   Gently massage your breast before and during the feeding.   Make sure that the baby empties at least one breast  at every feeding before switching sides.   Use a breast pump to empty the breasts if your baby is sleepy or not nursing well. You may also want to pump if you are returning to work or or you feel you are getting engorged.   Avoid bottle feeds, pacifiers or supplemental feedings of water or juice in place of breastfeeding.   Be sure the baby is latched on and positioned properly while breastfeeding.   Prevent fatigue, stress, and anemia.   Wear a supportive bra, avoiding underwire styles.   Eat a balanced diet with enough fluids.  If you follow these suggestions, your engorgement should improve in 24 to 48 hours. If you are still experiencing difficulty, call your lactation consultant or caregiver. IS MY BABY GETTING ENOUGH MILK? Sometimes, mothers worry about whether their babies are getting enough milk. You can be assured that your baby is getting enough milk if:  The baby is actively sucking and you hear swallowing.   The baby nurses at least 8 to 12 times in a 24 hour time period. Nurse your baby until he or she unlatches or falls asleep at the first breast (at least 10 to 20 minutes), then offer the second side.   The baby is wetting 5 to 6 disposable diapers (6 to 8 cloth diapers) in a 24 hour period by 5 to 6 days of age.   The baby is having at least 2 to 3 stools every 24 hours for the first few months. Breast milk is all the food your baby needs. It is not necessary for your baby to have water or formula. In fact, to help your breasts make more milk, it is best not to give your baby supplemental feedings during the early weeks.   The stool should be soft and yellow.   The baby should gain 4 to 7 ounces per week after he is 4 days old.  TAKE CARE OF YOURSELF Take care of your breasts by:  Bathing or showering daily.   Avoiding the use of soaps on your nipples.   Start feedings on your left breast at one feeding and on your right breast at the next feeding.   You will  notice an increase in your milk supply 2 to 5 days after delivery. You may feel some discomfort from engorgement, which makes your breasts very firm and often tender. Engorgement "peaks" out within 24 to 48 hours. In the meantime, apply warm moist towels to your   breasts for 5 to 10 minutes before feeding. Gentle massage and expression of some milk before feeding will soften your breasts, making it easier for your baby to latch on. Wear a well fitting nursing bra and air dry your nipples for 10 to 15 minutes after each feeding.   Only use cotton bra pads.   Only use pure lanolin on your nipples after nursing. You do not need to wash it off before nursing.  Take care of yourself by:   Eating well-balanced meals and nutritious snacks.   Drinking milk, fruit juice, and water to satisfy your thirst (about 8 glasses a day).   Getting plenty of rest.   Increasing calcium in your diet (1200 mg a day).   Avoiding foods that you notice affect the baby in a bad way.  SEEK MEDICAL CARE IF:   You have any questions or difficulty with breastfeeding.   You need help.   You have a hard, red, sore area on your breast, accompanied by a fever of 100.5 F (38.1 C) or more.   Your baby is too sleepy to eat well or is having trouble sleeping.   Your baby is wetting less than 6 diapers per day, by 61 days of age.   Your baby's skin or white part of his or her eyes is more yellow than it was in the hospital.   You feel depressed.  Document Released: 12/16/2005 Document Revised: 08/28/2011 Document Reviewed: 07/31/2009 [facility] Patient Information 2012 [place], [place]. Urinary Tract Infection in Pregnancy A urinary tract infection (UTI) is a bacterial infection of the urinary tract. Infection of the urinary tract can include the ureters, kidneys (pyelonephritis), bladder (cystitis), and urethra (urethritis). All pregnant women should be screened for bacteria in the urinary tract. Identifying and treating a  UTI will decrease the risk of preterm labor and developing more serious infections in both the mother and baby. CAUSES Bacteria germs cause almost all UTIs. There are many factors that can increase your chances of getting a UTI during pregnancy. These include:  Having a short urethra.   Poor toilet and hygiene habits.   Sexual intercourse.   Blockage of urine along the urinary tract.   Problems with the pelvic muscles or nerves.   Diabetes.   Obesity.   Bladder problems after having several children.   Previous history of UTI.  SYMPTOMS   Pain, burning, or a stinging feeling when urinating.   Suddenly feeling the need to urinate right away (urgency).   Loss of bladder control (urinary incontinence).   Frequent urination, more than is common with pregnancy.   Lower abdominal or back discomfort.   Bad smelling urine.   Cloudy urine.   Blood in the urine (hematuria).   Fever.  When the kidneys are infected, the symptoms may be:  Back pain.   Flank pain on the right side more so than the left.   Fever.   Chills.   Nausea.   Vomiting.  DIAGNOSIS   Urine tests.   Additional tests and procedures may include:   Ultrasound of the kidneys, ureters, bladder, and urethra.   Looking in the bladder with a lighted tube (cystoscopy).   Certain X-ray studies only when absolutely necessary.  Finding out the results of your test Ask when your test results will be ready. Make sure you get your test results. TREATMENT  Antibiotic medicine by mouth.   Antibiotics given through the vein (intravenously), if needed.  HOME CARE INSTRUCTIONS   Take  your antibiotics as directed. Finish them even if you start to feel better. Only take medicine as directed by your caregiver.   Drink enough fluids to keep your urine clear or pale yellow.   Do not have sexual intercourse until the infection is gone and your caregiver says it is okay.   Make sure you are tested for UTIs  throughout your pregnancy if you get one. These infections often come back.  Preventing a UTI in the future:  Practice good toilet habits. Always wipe from front to back. Use the tissue only once.   Do not hold your urine. Empty your bladder as soon as possible when the urge comes.   Do not douche or use deodorant sprays.   Wash with soap and warm water around the genital area and the anus.   Empty your bladder before and after sexual intercourse.   Wear underwear with a cotton crotch.   Avoid caffeine and carbonated drinks. They can irritate the bladder.   Drink cranberry juice or take cranberry pills. This may decrease the risk of getting a UTI.   Do not drink alcohol.   Keep all your appointments and tests as scheduled.  SEEK MEDICAL CARE IF:   Your symptoms get worse.   You are still having fevers 2 or more days after treatment begins.   You develop a rash.   You feel that you are having problems with medicines prescribed.   You develop abnormal vaginal discharge.  SEEK IMMEDIATE MEDICAL CARE IF:   You develop back or flank pain.   You develop chills.   You have blood in your urine.   You develop nausea and vomiting.   You develop contractions of your uterus.   You have a gush of fluid from the vagina.  MAKE SURE YOU:   Understand these instructions.   Will watch your condition.   Will get help right away if you are not doing well or get worse.  Document Released: 04/12/2011 Document Revised: 08/28/2011 Document Reviewed: 04/12/2011 [facility] Patient Information 2012 [place], [place].

## 2012-01-23 ENCOUNTER — Ambulatory Visit (INDEPENDENT_AMBULATORY_CARE_PROVIDER_SITE_OTHER): Payer: Medicaid Other | Admitting: Obstetrics and Gynecology

## 2012-01-23 DIAGNOSIS — R809 Proteinuria, unspecified: Secondary | ICD-10-CM

## 2012-01-23 DIAGNOSIS — O0990 Supervision of high risk pregnancy, unspecified, unspecified trimester: Secondary | ICD-10-CM

## 2012-01-23 DIAGNOSIS — O1210 Gestational proteinuria, unspecified trimester: Secondary | ICD-10-CM

## 2012-01-23 DIAGNOSIS — O26839 Pregnancy related renal disease, unspecified trimester: Secondary | ICD-10-CM

## 2012-01-23 DIAGNOSIS — O10019 Pre-existing essential hypertension complicating pregnancy, unspecified trimester: Secondary | ICD-10-CM

## 2012-01-23 LAB — POCT URINALYSIS DIP (DEVICE)
Nitrite: NEGATIVE
Specific Gravity, Urine: >=1.03 (ref 1.005–1.030)
Urobilinogen, UA: 0.2 mg/dL (ref 0.0–1.0)
pH: 6 (ref 5.0–8.0)

## 2012-01-23 NOTE — Progress Notes (Signed)
Patient doing well. Persistent proteinuria with negative urine culture. Will repeat 24hr urine protein. Patient continues to take prophylactic macrobid

## 2012-01-27 ENCOUNTER — Telehealth: Payer: Self-pay | Admitting: *Deleted

## 2012-01-27 ENCOUNTER — Ambulatory Visit (INDEPENDENT_AMBULATORY_CARE_PROVIDER_SITE_OTHER): Payer: Medicaid Other | Admitting: Obstetrics and Gynecology

## 2012-01-27 DIAGNOSIS — O10019 Pre-existing essential hypertension complicating pregnancy, unspecified trimester: Secondary | ICD-10-CM

## 2012-01-27 DIAGNOSIS — J45909 Unspecified asthma, uncomplicated: Secondary | ICD-10-CM

## 2012-01-27 DIAGNOSIS — R809 Proteinuria, unspecified: Secondary | ICD-10-CM

## 2012-01-27 DIAGNOSIS — O1210 Gestational proteinuria, unspecified trimester: Secondary | ICD-10-CM

## 2012-01-27 DIAGNOSIS — N883 Incompetence of cervix uteri: Secondary | ICD-10-CM

## 2012-01-27 DIAGNOSIS — O3430 Maternal care for cervical incompetence, unspecified trimester: Secondary | ICD-10-CM

## 2012-01-27 DIAGNOSIS — A6000 Herpesviral infection of urogenital system, unspecified: Secondary | ICD-10-CM

## 2012-01-27 DIAGNOSIS — O0990 Supervision of high risk pregnancy, unspecified, unspecified trimester: Secondary | ICD-10-CM

## 2012-01-27 DIAGNOSIS — O26839 Pregnancy related renal disease, unspecified trimester: Secondary | ICD-10-CM

## 2012-01-27 LAB — POCT URINALYSIS DIP (DEVICE)
Bilirubin Urine: NEGATIVE
Glucose, UA: NEGATIVE mg/dL
Hgb urine dipstick: NEGATIVE
Ketones, ur: NEGATIVE mg/dL
Leukocytes, UA: NEGATIVE
Nitrite: NEGATIVE
Protein, ur: NEGATIVE mg/dL
Specific Gravity, Urine: 1.015 (ref 1.005–1.030)
Urobilinogen, UA: 0.2 mg/dL (ref 0.0–1.0)
pH: 7 (ref 5.0–8.0)

## 2012-01-27 MED ORDER — ALBUTEROL 90 MCG/ACT IN AERS: 2.0000 | INHALATION_SPRAY | Freq: Four times a day (QID) | RESPIRATORY_TRACT | Status: DC | PRN

## 2012-01-27 NOTE — Progress Notes (Signed)
Has pelvic pain that radiates through to her back.

## 2012-01-27 NOTE — Progress Notes (Signed)
Patient c/o severe cramping pain for 1 hour this morning. She denies any vaginal bleeding or discharged. The pain has since subsided.Cx Closed/thick/long/ cerclage in place and intact, no tension on suture. Patient scheduled for ROB next week and is planning on returning 24hr urine collection at that time. Reassurance provided. Patient to return to clinic or [facility] if symptoms worsen

## 2012-01-27 NOTE — Telephone Encounter (Signed)
Telephoned pt at home #. Pt states having pain since 5am. Lower back and lower abdomen is hurting. No vaginal bleeding. Pain scale is 4 to 5. Pt is scheduled for 10:30.

## 2012-01-27 NOTE — Telephone Encounter (Signed)
Pt called stating having pain since 5:00 am. Needs advice.

## 2012-01-29 LAB — COMPREHENSIVE METABOLIC PANEL WITH GFR
ALT: 20 U/L (ref 0–35)
AST: 22 U/L (ref 0–37)
Albumin: 3.8 g/dL (ref 3.5–5.2)
Alkaline Phosphatase: 74 U/L (ref 39–117)
BUN: 10 mg/dL (ref 6–23)
CO2: 23 meq/L (ref 19–32)
Calcium: 9 mg/dL (ref 8.4–10.5)
Chloride: 104 meq/L (ref 96–112)
Creat: 0.59 mg/dL (ref 0.50–1.10)
Glucose, Bld: 72 mg/dL (ref 70–99)
Potassium: 4 meq/L (ref 3.5–5.3)
Sodium: 138 meq/L (ref 135–145)
Total Bilirubin: 0.3 mg/dL (ref 0.3–1.2)
Total Protein: 6.9 g/dL (ref 6.0–8.3)

## 2012-01-29 LAB — CBC
HCT: 36.7 % (ref 36.0–46.0)
Hemoglobin: 12.2 g/dL (ref 12.0–15.0)
MCHC: 33.2 g/dL (ref 30.0–36.0)
MCV: 90.2 fL (ref 78.0–100.0)
RDW: 13.4 % (ref 11.5–15.5)
WBC: 4.5 10*3/uL (ref 4.0–10.5)

## 2012-01-30 LAB — PROTEIN, URINE, 24 HOUR: Protein, 24H Urine: 156 mg/d — ABNORMAL HIGH (ref 50–100)

## 2012-01-31 ENCOUNTER — Encounter: Payer: Self-pay | Admitting: Obstetrics and Gynecology

## 2012-02-06 ENCOUNTER — Ambulatory Visit (INDEPENDENT_AMBULATORY_CARE_PROVIDER_SITE_OTHER): Payer: Medicaid Other | Admitting: Family Medicine

## 2012-02-06 DIAGNOSIS — O26839 Pregnancy related renal disease, unspecified trimester: Secondary | ICD-10-CM

## 2012-02-06 DIAGNOSIS — O3430 Maternal care for cervical incompetence, unspecified trimester: Secondary | ICD-10-CM

## 2012-02-06 DIAGNOSIS — R809 Proteinuria, unspecified: Secondary | ICD-10-CM

## 2012-02-06 DIAGNOSIS — O0990 Supervision of high risk pregnancy, unspecified, unspecified trimester: Secondary | ICD-10-CM

## 2012-02-06 DIAGNOSIS — O1210 Gestational proteinuria, unspecified trimester: Secondary | ICD-10-CM

## 2012-02-06 DIAGNOSIS — N883 Incompetence of cervix uteri: Secondary | ICD-10-CM

## 2012-02-06 DIAGNOSIS — N289 Disorder of kidney and ureter, unspecified: Secondary | ICD-10-CM

## 2012-02-06 LAB — POCT URINALYSIS DIP (DEVICE)
Bilirubin Urine: NEGATIVE
Glucose, UA: NEGATIVE mg/dL
Hgb urine dipstick: NEGATIVE
Ketones, ur: NEGATIVE mg/dL
Specific Gravity, Urine: 1.02 (ref 1.005–1.030)

## 2012-02-06 NOTE — Patient Instructions (Signed)
Pregnancy - Second Trimester The second trimester of pregnancy (3 to 6 months) is a period of rapid growth for you and your baby. At the end of the sixth month, your baby is about 9 inches long and weighs 1 1/2 pounds. You will begin to feel the baby move between 18 and 20 weeks of the pregnancy. This is called quickening. Weight gain is faster. A clear fluid (colostrum) may leak out of your breasts. You may feel small contractions of the womb (uterus). This is known as false labor or Braxton-Hicks contractions. This is like a practice for labor when the baby is ready to be born. Usually, the problems with morning sickness have usually passed by the end of your first trimester. Some women develop small dark blotches (called cholasma, mask of pregnancy) on their face that usually goes away after the baby is born. Exposure to the sun makes the blotches worse. Acne may also develop in some pregnant women and pregnant women who have acne, may find that it goes away. PRENATAL EXAMS  Blood work may continue to be done during prenatal exams. These tests are done to check on your health and the probable health of your baby. Blood work is used to follow your blood levels (hemoglobin). Anemia (low hemoglobin) is common during pregnancy. Iron and vitamins are given to help prevent this. You will also be checked for diabetes between 24 and 28 weeks of the pregnancy. Some of the previous blood tests may be repeated.   The size of the uterus is measured during each visit. This is to make sure that the baby is continuing to grow properly according to the dates of the pregnancy.   Your blood pressure is checked every prenatal visit. This is to make sure you are not getting toxemia.   Your urine is checked to make sure you do not have an infection, diabetes or protein in the urine.   Your weight is checked often to make sure gains are happening at the suggested rate. This is to ensure that both you and your baby are  growing normally.   Sometimes, an ultrasound is performed to confirm the proper growth and development of the baby. This is a test which bounces harmless sound waves off the baby so your caregiver can more accurately determine due dates.  Sometimes, a specialized test is done on the amniotic fluid surrounding the baby. This test is called an amniocentesis. The amniotic fluid is obtained by sticking a needle into the belly (abdomen). This is done to check the chromosomes in instances where there is a concern about possible genetic problems with the baby. It is also sometimes done near the end of pregnancy if an early delivery is required. In this case, it is done to help make sure the baby's lungs are mature enough for the baby to live outside of the womb. CHANGES OCCURING IN THE SECOND TRIMESTER OF PREGNANCY Your body goes through many changes during pregnancy. They vary from person to person. Talk to your caregiver about changes you notice that you are concerned about.  During the second trimester, you will likely have an increase in your appetite. It is normal to have cravings for certain foods. This varies from person to person and pregnancy to pregnancy.   Your lower abdomen will begin to bulge.   You may have to urinate more often because the uterus and baby are pressing on your bladder. It is also common to get more bladder infections during pregnancy (  pain with urination). You can help this by drinking lots of fluids and emptying your bladder before and after intercourse.   You may begin to get stretch marks on your hips, abdomen, and breasts. These are normal changes in the body during pregnancy. There are no exercises or medications to take that prevent this change.   You may begin to develop swollen and bulging veins (varicose veins) in your legs. Wearing support hose, elevating your feet for 15 minutes, 3 to 4 times a day and limiting salt in your diet helps lessen the problem.    Heartburn may develop as the uterus grows and pushes up against the stomach. Antacids recommended by your caregiver helps with this problem. Also, eating smaller meals 4 to 5 times a day helps.   Constipation can be treated with a stool softener or adding bulk to your diet. Drinking lots of fluids, vegetables, fruits, and whole grains are helpful.   Exercising is also helpful. If you have been very active up until your pregnancy, most of these activities can be continued during your pregnancy. If you have been less active, it is helpful to start an exercise program such as walking.   Hemorrhoids (varicose veins in the rectum) may develop at the end of the second trimester. Warm sitz baths and hemorrhoid cream recommended by your caregiver helps hemorrhoid problems.   Backaches may develop during this time of your pregnancy. Avoid heavy lifting, wear low heal shoes and practice good posture to help with backache problems.   Some pregnant women develop tingling and numbness of their hand and fingers because of swelling and tightening of ligaments in the wrist (carpel tunnel syndrome). This goes away after the baby is born.   As your breasts enlarge, you may have to get a bigger bra. Get a comfortable, cotton, support bra. Do not get a nursing bra until the last month of the pregnancy if you will be nursing the baby.   You may get a dark line from your belly button to the pubic area called the linea nigra.   You may develop rosy cheeks because of increase blood flow to the face.   You may develop spider looking lines of the face, neck, arms and chest. These go away after the baby is born.  HOME CARE INSTRUCTIONS   It is extremely important to avoid all smoking, herbs, alcohol, and unprescribed drugs during your pregnancy. These chemicals affect the formation and growth of the baby. Avoid these chemicals throughout the pregnancy to ensure the delivery of a healthy infant.   Most of your home  care instructions are the same as suggested for the first trimester of your pregnancy. Keep your caregiver's appointments. Follow your caregiver's instructions regarding medication use, exercise and diet.   During pregnancy, you are providing food for you and your baby. Continue to eat regular, well-balanced meals. Choose foods such as meat, fish, milk and other low fat dairy products, vegetables, fruits, and whole-grain breads and cereals. Your caregiver will tell you of the ideal weight gain.   A physical sexual relationship may be continued up until near the end of pregnancy if there are no other problems. Problems could include early (premature) leaking of amniotic fluid from the membranes, vaginal bleeding, abdominal pain, or other medical or pregnancy problems.   Exercise regularly if there are no restrictions. Check with your caregiver if you are unsure of the safety of some of your exercises. The greatest weight gain will occur in the   last 2 trimesters of pregnancy. Exercise will help you:   Control your weight.   Get you in shape for labor and delivery.   Lose weight after you have the baby.   Wear a good support or jogging bra for breast tenderness during pregnancy. This may help if worn during sleep. Pads or tissues may be used in the bra if you are leaking colostrum.   Do not use hot tubs, steam rooms or saunas throughout the pregnancy.   Wear your seat belt at all times when driving. This protects you and your baby if you are in an accident.   Avoid raw meat, uncooked cheese, cat litter boxes and soil used by cats. These carry germs that can cause birth defects in the baby.   The second trimester is also a good time to visit your dentist for your dental health if this has not been done yet. Getting your teeth cleaned is OK. Use a soft toothbrush. Brush gently during pregnancy.   It is easier to loose urine during pregnancy. Tightening up and strengthening the pelvic muscles will  help with this problem. Practice stopping your urination while you are going to the bathroom. These are the same muscles you need to strengthen. It is also the muscles you would use as if you were trying to stop from passing gas. You can practice tightening these muscles up 10 times a set and repeating this about 3 times per day. Once you know what muscles to tighten up, do not perform these exercises during urination. It is more likely to contribute to an infection by backing up the urine.   Ask for help if you have financial, counseling or nutritional needs during pregnancy. Your caregiver will be able to offer counseling for these needs as well as refer you for other special needs.   Your skin may become oily. If so, wash your face with mild soap, use non-greasy moisturizer and oil or cream based makeup.  MEDICATIONS AND DRUG USE IN PREGNANCY  Take prenatal vitamins as directed. The vitamin should contain 1 milligram of folic acid. Keep all vitamins out of reach of children. Only a couple vitamins or tablets containing iron may be fatal to a baby or young child when ingested.   Avoid use of all medications, including herbs, over-the-counter medications, not prescribed or suggested by your caregiver. Only take over-the-counter or prescription medicines for pain, discomfort, or fever as directed by your caregiver. Do not use aspirin.   Let your caregiver also know about herbs you may be using.   Alcohol is related to a number of birth defects. This includes fetal alcohol syndrome. All alcohol, in any form, should be avoided completely. Smoking will cause low birth rate and premature babies.   Street or illegal drugs are very harmful to the baby. They are absolutely forbidden. A baby born to an addicted mother will be addicted at birth. The baby will go through the same withdrawal an adult does.  SEEK MEDICAL CARE IF:  You have any concerns or worries during your pregnancy. It is better to call with  your questions if you feel they cannot wait, rather than worry about them. SEEK IMMEDIATE MEDICAL CARE IF:   An unexplained oral temperature above 102 F (38.9 C) develops, or as your caregiver suggests.   You have leaking of fluid from the vagina (birth canal). If leaking membranes are suspected, take your temperature and tell your caregiver of this when you call.   There   is vaginal spotting, bleeding, or passing clots. Tell your caregiver of the amount and how many pads are used. Light spotting in pregnancy is common, especially following intercourse.   You develop a bad smelling vaginal discharge with a change in the color from clear to white.   You continue to feel sick to your stomach (nauseated) and have no relief from remedies suggested. You vomit blood or coffee ground-like materials.   You lose more than 2 pounds of weight or gain more than 2 pounds of weight over 1 week, or as suggested by your caregiver.   You notice swelling of your face, hands, feet, or legs.   You get exposed to German measles and have never had them.   You are exposed to fifth disease or chickenpox.   You develop belly (abdominal) pain. Round ligament discomfort is a common non-cancerous (benign) cause of abdominal pain in pregnancy. Your caregiver still must evaluate you.   You develop a bad headache that does not go away.   You develop fever, diarrhea, pain with urination, or shortness of breath.   You develop visual problems, blurry, or double vision.   You fall or are in a car accident or any kind of trauma.   There is mental or physical violence at home.  Document Released: 12/10/2001 Document Revised: 08/28/2011 Document Reviewed: 06/14/2009 [employer] Patient Information 2012 [employer]. Birth Control Choices Birth control is the use of any practices, methods, or devices to prevent pregnancy from happening in a sexually active woman.  Below are some birth control choices to help avoid  pregnancy.  Not having sex (abstinence) is the surest form of birth control. This requires self-control. There is no risk of acquiring a sexually transmitted disease (STD), including acquired immunodeficiency syndrome (AIDS).   Periodic abstinence requires self-control during certain times of the month.   Calendar method, timing your menstrual periods from month to month.   Ovulation method is avoiding sexual intercourse around the time you produce an egg (ovulate).   Symptotherm method is avoiding sexual intercourse at the time of ovulation, using a thermometer and ovulation symptoms.   Post ovulation method is the timing of sexual intercourse after you ovulated.  These methods do not protect against STDs, including AIDS.  Birth control pills (BCPs) contain estrogen and progesterone hormone. These medicines work by stopping the egg from forming in the ovary (ovulation). Birth control pills are prescribed by a caregiver who will ask you questions about the risks of taking BCPs. Birth control pills do not protect against STDs, including AIDS.   "Minipill" birth control pills have only the progesterone hormone. They are taken every day of each month and must be prescribed by your caregiver. They do not protect against STDs, including AIDS.   Emergency contraception is often call the "morning after" pill. This pill can be taken right after sex or up to five days after sex if you think your birth control failed, you failed to use contraception, or you were forced to have sex. It is most effective the sooner you take the pills after having sexual intercourse. Do not use emergency contraception as your only form of birth control. Emergency contraceptive pills are available without a prescription. Check with your pharmacist.   Condoms are a thin sheath of latex, synthetic material, or lambskin worn over the penis during sexual intercourse. They can have a spermicide in or on them when you buy them.  Latex condoms can prevent pregnancy and STDs. "Natural" or lambskin   condoms can prevent pregnancy but may not protect against STDs, including AIDS.   Female condoms are a soft, loose-fitting sheath that is put into the vagina before sexual intercourse. They can prevent pregnancy and STDs, including AIDS.   Sponge is a soft, circular piece of polyurethane foam with spermicide in it that is inserted into the vagina after wetting it and before sexual intercourse. It does not require a prescription from your caregiver. It does not protect against STDs, including AIDS.   Diaphragm is a soft, latex, dome-shaped barrier that must be fitted by a caregiver. It is inserted into the vagina, along with a spermicidal jelly. After the proper fitting for a diaphragm, always insert the diaphragm before intercourse. The diaphragm should be left in the vagina for 6 to 8 hours after intercourse. Removal and reinsertion with a spermicide is always necessary after any use. It does not protect against STDs, including AIDS.   Progesterone-only injections are given every 3 months to prevent pregnancy. These injections contain synthetic progesterone and no estrogen. This hormone stops the ovaries from releasing eggs. It also causes the cervical mucus to thicken and changes the uterine lining. This makes it harder for sperm to survive in the uterus. It does not protect against STDs, including AIDS.   Birth Control Patch contains hormones similar to those in birth control pills, so effectiveness, risks, and side effects are similar. It must be changed once a week and is prescribed by a caregiver. It is less effective in very overweight women. It does not protect against STDs, including AIDS.   Vaginal Ring contains hormones similar to those in birth control pills. It is left in place for 3 weeks, removed for 1 week, and then a new one is put back into the vagina. It comes with a timer to put in your purse to help you remember when  to take it out or put a new one in. A caregiver's examination and prescription is necessary, just like with birth control pills and the patch. It does not protect against STDs, including AIDS.   Estrogen plus progesterone injections are given every 28 to 30 days. They can be given in the upper arm, thigh, or buttocks. It does not protect against STDs, including AIDS.   Intrauterine device (IUD): copper T or progestin filled is a T-shaped device that is put in a woman's uterus during a menstrual period to prevent pregnancy. The copper T IUD can last 10 years, and the progestin IUD can last 5 years. The progestin IUD can also help control heavy menstrual periods. It does not protect against STDs, including AIDS. The copper T IUD can be used as emergency contraception if inserted within 5 days of having unprotected intercourse.   Cervical cap is a round, soft latex or plastic cup that fits over the cervix and must be fitted by a caregiver. You do not need to use a spermicide with it or remove and insert it every time you have sexual intercourse. It does not protect against STDs, including AIDS.   Spermicides are chemicals that kill or block sperm from entering the cervix and uterus. They come in the form of creams, jellies, suppositories, foam, or tablets, and they do not require a prescription. They are inserted into the vagina with an applicator before having sexual intercourse. This must be repeated every time you have sexual intercourse.   Withdrawal is using the method of the female withdrawing his penis from sexual intercourse before he has a climax   and deposits his sperm. It does not protect against STDs, including AIDS.   Female tubal ligation is when the woman's fallopian tubes are surgically sealed or tied to prevent the egg from traveling to the uterus. It does not protect against STDs, including AIDS.   Female sterilization is when the female has his tubes that carry sperm tied off (vasectomy) to  stop sperm from entering the vagina during sexual intercourse. It does not protect against STDs, including AIDS.  Regardless of which method of birth control you choose, it is still important that you use some form of protection against STDs. Document Released: 12/16/2005 Document Revised: 01/18/2011 Document Reviewed: 11/02/2009 [employer] Patient Information 2012 [employer]. Breastfeeding BENEFITS OF BREASTFEEDING For the baby  The first milk (colostrum) helps the baby's digestive system function better.   There are antibodies from the mother in the milk that help the baby fight off infections.   The baby has a lower incidence of asthma, allergies, and SIDS (sudden infant death syndrome).   The nutrients in breast milk are better than formulas for the baby and helps the baby's brain grow better.   Babies who breastfeed have less gas, colic, and constipation.  For the mother  Breastfeeding helps develop a very special bond between mother and baby.   It is more convenient, always available at the correct temperature and cheaper than formula feeding.   It burns calories in the mother and helps with losing weight that was gained during pregnancy.   It makes the uterus contract back down to normal size faster and slows bleeding following delivery.   Breastfeeding mothers have a lower risk of developing breast cancer.  NURSE FREQUENTLY  A healthy, full-term baby may breastfeed as often as every hour or space his or her feedings to every 3 hours.   How often to nurse will vary from baby to baby. Watch your baby for signs of hunger, not the clock.   Nurse as often as the baby requests, or when you feel the need to reduce the fullness of your breasts.   Awaken the baby if it has been 3 to 4 hours since the last feeding.   Frequent feeding will help the mother make more milk and will prevent problems like sore nipples and engorgement of the breasts.  BABY'S POSITION AT THE  BREAST  Whether lying down or sitting, be sure that the baby's tummy is facing your tummy.   Support the breast with 4 fingers underneath the breast and the thumb above. Make sure your fingers are well away from the nipple and baby's mouth.   Stroke the baby's lips and cheek closest to the breast gently with your finger or nipple.   When the baby's mouth is open wide enough, place all of your nipple and as much of the dark area around the nipple as possible into your baby's mouth.   Pull the baby in close so the tip of the nose and the baby's cheeks touch the breast during the feeding.  FEEDINGS  The length of each feeding varies from baby to baby and from feeding to feeding.   The baby must suck about 2 to 3 minutes for your milk to get to him or her. This is called a "let down." For this reason, allow the baby to feed on each breast as long as he or she wants. Your baby will end the feeding when he or she has received the right balance of nutrients.   To   break the suction, put your finger into the corner of the baby's mouth and slide it between his or her gums before removing your breast from his or her mouth. This will help prevent sore nipples.  REDUCING BREAST ENGORGEMENT  In the first week after your baby is born, you may experience signs of breast engorgement. When breasts are engorged, they feel heavy, warm, full, and may be tender to the touch. You can reduce engorgement if you:   Nurse frequently, every 2 to 3 hours. Mothers who breastfeed early and often have fewer problems with engorgement.   Place light ice packs on your breasts between feedings. This reduces swelling. Wrap the ice packs in a lightweight towel to protect your skin.   Apply moist hot packs to your breast for 5 to 10 minutes before each feeding. This increases circulation and helps the milk flow.   Gently massage your breast before and during the feeding.   Make sure that the baby empties at least one breast  at every feeding before switching sides.   Use a breast pump to empty the breasts if your baby is sleepy or not nursing well. You may also want to pump if you are returning to work or or you feel you are getting engorged.   Avoid bottle feeds, pacifiers or supplemental feedings of water or juice in place of breastfeeding.   Be sure the baby is latched on and positioned properly while breastfeeding.   Prevent fatigue, stress, and anemia.   Wear a supportive bra, avoiding underwire styles.   Eat a balanced diet with enough fluids.  If you follow these suggestions, your engorgement should improve in 24 to 48 hours. If you are still experiencing difficulty, call your lactation consultant or caregiver. IS MY BABY GETTING ENOUGH MILK? Sometimes, mothers worry about whether their babies are getting enough milk. You can be assured that your baby is getting enough milk if:  The baby is actively sucking and you hear swallowing.   The baby nurses at least 8 to 12 times in a 24 hour time period. Nurse your baby until he or she unlatches or falls asleep at the first breast (at least 10 to 20 minutes), then offer the second side.   The baby is wetting 5 to 6 disposable diapers (6 to 8 cloth diapers) in a 24 hour period by 5 to 6 days of age.   The baby is having at least 2 to 3 stools every 24 hours for the first few months. Breast milk is all the food your baby needs. It is not necessary for your baby to have water or formula. In fact, to help your breasts make more milk, it is best not to give your baby supplemental feedings during the early weeks.   The stool should be soft and yellow.   The baby should gain 4 to 7 ounces per week after he is 4 days old.  TAKE CARE OF YOURSELF Take care of your breasts by:  Bathing or showering daily.   Avoiding the use of soaps on your nipples.   Start feedings on your left breast at one feeding and on your right breast at the next feeding.   You will  notice an increase in your milk supply 2 to 5 days after delivery. You may feel some discomfort from engorgement, which makes your breasts very firm and often tender. Engorgement "peaks" out within 24 to 48 hours. In the meantime, apply warm moist towels to your   breasts for 5 to 10 minutes before feeding. Gentle massage and expression of some milk before feeding will soften your breasts, making it easier for your baby to latch on. Wear a well fitting nursing bra and air dry your nipples for 10 to 15 minutes after each feeding.   Only use cotton bra pads.   Only use pure lanolin on your nipples after nursing. You do not need to wash it off before nursing.  Take care of yourself by:   Eating well-balanced meals and nutritious snacks.   Drinking milk, fruit juice, and water to satisfy your thirst (about 8 glasses a day).   Getting plenty of rest.   Increasing calcium in your diet (1200 mg a day).   Avoiding foods that you notice affect the baby in a bad way.  SEEK MEDICAL CARE IF:   You have any questions or difficulty with breastfeeding.   You need help.   You have a hard, red, sore area on your breast, accompanied by a fever of 100.5 F (38.1 C) or more.   Your baby is too sleepy to eat well or is having trouble sleeping.   Your baby is wetting less than 6 diapers per day, by 5 days of age.   Your baby's skin or white part of his or her eyes is more yellow than it was in the hospital.   You feel depressed.  Document Released: 12/16/2005 Document Revised: 08/28/2011 Document Reviewed: 07/31/2009 [employer] Patient Information 2012 [employer]. 

## 2012-02-06 NOTE — Progress Notes (Signed)
No VB, no LOF, no ctx's--24 hour urine ok, no prot. today

## 2012-02-20 ENCOUNTER — Ambulatory Visit (INDEPENDENT_AMBULATORY_CARE_PROVIDER_SITE_OTHER): Payer: Medicaid Other | Admitting: Family Medicine

## 2012-02-20 ENCOUNTER — Encounter: Payer: Self-pay | Admitting: Family Medicine

## 2012-02-20 DIAGNOSIS — N289 Disorder of kidney and ureter, unspecified: Secondary | ICD-10-CM

## 2012-02-20 DIAGNOSIS — R809 Proteinuria, unspecified: Secondary | ICD-10-CM

## 2012-02-20 DIAGNOSIS — O1210 Gestational proteinuria, unspecified trimester: Secondary | ICD-10-CM

## 2012-02-20 DIAGNOSIS — O3430 Maternal care for cervical incompetence, unspecified trimester: Secondary | ICD-10-CM

## 2012-02-20 DIAGNOSIS — N883 Incompetence of cervix uteri: Secondary | ICD-10-CM

## 2012-02-20 DIAGNOSIS — B009 Herpesviral infection, unspecified: Secondary | ICD-10-CM

## 2012-02-20 DIAGNOSIS — O26839 Pregnancy related renal disease, unspecified trimester: Secondary | ICD-10-CM

## 2012-02-20 LAB — POCT URINALYSIS DIP (DEVICE)
Glucose, UA: NEGATIVE mg/dL
Hgb urine dipstick: NEGATIVE
Leukocytes, UA: NEGATIVE
Nitrite: NEGATIVE
pH: 6 (ref 5.0–8.0)

## 2012-02-20 MED ORDER — VALACYCLOVIR HCL 1 G PO TABS: 1000.0000 mg | ORAL_TABLET | Freq: Two times a day (BID) | ORAL | Status: DC

## 2012-02-20 NOTE — Progress Notes (Signed)
P=91, discussed TDAP, patient states not ready to get it today, is  considering

## 2012-02-20 NOTE — Patient Instructions (Signed)
Pregnancy - Second Trimester The second trimester of pregnancy (3 to 6 months) is a period of rapid growth for you and your baby. At the end of the sixth month, your baby is about 9 inches long and weighs 1 1/2 pounds. You will begin to feel the baby move between 18 and 20 weeks of the pregnancy. This is called quickening. Weight gain is faster. A clear fluid (colostrum) may leak out of your breasts. You may feel small contractions of the womb (uterus). This is known as false labor or Braxton-Hicks contractions. This is like a practice for labor when the baby is ready to be born. Usually, the problems with morning sickness have usually passed by the end of your first trimester. Some women develop small dark blotches (called cholasma, mask of pregnancy) on their face that usually goes away after the baby is born. Exposure to the sun makes the blotches worse. Acne may also develop in some pregnant women and pregnant women who have acne, may find that it goes away. PRENATAL EXAMS  Blood work may continue to be done during prenatal exams. These tests are done to check on your health and the probable health of your baby. Blood work is used to follow your blood levels (hemoglobin). Anemia (low hemoglobin) is common during pregnancy. Iron and vitamins are given to help prevent this. You will also be checked for diabetes between 24 and 28 weeks of the pregnancy. Some of the previous blood tests may be repeated.   The size of the uterus is measured during each visit. This is to make sure that the baby is continuing to grow properly according to the dates of the pregnancy.   Your blood pressure is checked every prenatal visit. This is to make sure you are not getting toxemia.   Your urine is checked to make sure you do not have an infection, diabetes or protein in the urine.   Your weight is checked often to make sure gains are happening at the suggested rate. This is to ensure that both you and your baby are  growing normally.   Sometimes, an ultrasound is performed to confirm the proper growth and development of the baby. This is a test which bounces harmless sound waves off the baby so your caregiver can more accurately determine due dates.  Sometimes, a specialized test is done on the amniotic fluid surrounding the baby. This test is called an amniocentesis. The amniotic fluid is obtained by sticking a needle into the belly (abdomen). This is done to check the chromosomes in instances where there is a concern about possible genetic problems with the baby. It is also sometimes done near the end of pregnancy if an early delivery is required. In this case, it is done to help make sure the baby's lungs are mature enough for the baby to live outside of the womb. CHANGES OCCURING IN THE SECOND TRIMESTER OF PREGNANCY Your body goes through many changes during pregnancy. They vary from person to person. Talk to your caregiver about changes you notice that you are concerned about.  During the second trimester, you will likely have an increase in your appetite. It is normal to have cravings for certain foods. This varies from person to person and pregnancy to pregnancy.   Your lower abdomen will begin to bulge.   You may have to urinate more often because the uterus and baby are pressing on your bladder. It is also common to get more bladder infections during pregnancy (  pain with urination). You can help this by drinking lots of fluids and emptying your bladder before and after intercourse.   You may begin to get stretch marks on your hips, abdomen, and breasts. These are normal changes in the body during pregnancy. There are no exercises or medications to take that prevent this change.   You may begin to develop swollen and bulging veins (varicose veins) in your legs. Wearing support hose, elevating your feet for 15 minutes, 3 to 4 times a day and limiting salt in your diet helps lessen the problem.    Heartburn may develop as the uterus grows and pushes up against the stomach. Antacids recommended by your caregiver helps with this problem. Also, eating smaller meals 4 to 5 times a day helps.   Constipation can be treated with a stool softener or adding bulk to your diet. Drinking lots of fluids, vegetables, fruits, and whole grains are helpful.   Exercising is also helpful. If you have been very active up until your pregnancy, most of these activities can be continued during your pregnancy. If you have been less active, it is helpful to start an exercise program such as walking.   Hemorrhoids (varicose veins in the rectum) may develop at the end of the second trimester. Warm sitz baths and hemorrhoid cream recommended by your caregiver helps hemorrhoid problems.   Backaches may develop during this time of your pregnancy. Avoid heavy lifting, wear low heal shoes and practice good posture to help with backache problems.   Some pregnant women develop tingling and numbness of their hand and fingers because of swelling and tightening of ligaments in the wrist (carpel tunnel syndrome). This goes away after the baby is born.   As your breasts enlarge, you may have to get a bigger bra. Get a comfortable, cotton, support bra. Do not get a nursing bra until the last month of the pregnancy if you will be nursing the baby.   You may get a dark line from your belly button to the pubic area called the linea nigra.   You may develop rosy cheeks because of increase blood flow to the face.   You may develop spider looking lines of the face, neck, arms and chest. These go away after the baby is born.  HOME CARE INSTRUCTIONS   It is extremely important to avoid all smoking, herbs, alcohol, and unprescribed drugs during your pregnancy. These chemicals affect the formation and growth of the baby. Avoid these chemicals throughout the pregnancy to ensure the delivery of a healthy infant.   Most of your home  care instructions are the same as suggested for the first trimester of your pregnancy. Keep your caregiver's appointments. Follow your caregiver's instructions regarding medication use, exercise and diet.   During pregnancy, you are providing food for you and your baby. Continue to eat regular, well-balanced meals. Choose foods such as meat, fish, milk and other low fat dairy products, vegetables, fruits, and whole-grain breads and cereals. Your caregiver will tell you of the ideal weight gain.   A physical sexual relationship may be continued up until near the end of pregnancy if there are no other problems. Problems could include early (premature) leaking of amniotic fluid from the membranes, vaginal bleeding, abdominal pain, or other medical or pregnancy problems.   Exercise regularly if there are no restrictions. Check with your caregiver if you are unsure of the safety of some of your exercises. The greatest weight gain will occur in the   last 2 trimesters of pregnancy. Exercise will help you:   Control your weight.   Get you in shape for labor and delivery.   Lose weight after you have the baby.   Wear a good support or jogging bra for breast tenderness during pregnancy. This may help if worn during sleep. Pads or tissues may be used in the bra if you are leaking colostrum.   Do not use hot tubs, steam rooms or saunas throughout the pregnancy.   Wear your seat belt at all times when driving. This protects you and your baby if you are in an accident.   Avoid raw meat, uncooked cheese, cat litter boxes and soil used by cats. These carry germs that can cause birth defects in the baby.   The second trimester is also a good time to visit your dentist for your dental health if this has not been done yet. Getting your teeth cleaned is OK. Use a soft toothbrush. Brush gently during pregnancy.   It is easier to loose urine during pregnancy. Tightening up and strengthening the pelvic muscles will  help with this problem. Practice stopping your urination while you are going to the bathroom. These are the same muscles you need to strengthen. It is also the muscles you would use as if you were trying to stop from passing gas. You can practice tightening these muscles up 10 times a set and repeating this about 3 times per day. Once you know what muscles to tighten up, do not perform these exercises during urination. It is more likely to contribute to an infection by backing up the urine.   Ask for help if you have financial, counseling or nutritional needs during pregnancy. Your caregiver will be able to offer counseling for these needs as well as refer you for other special needs.   Your skin may become oily. If so, wash your face with mild soap, use non-greasy moisturizer and oil or cream based makeup.  MEDICATIONS AND DRUG USE IN PREGNANCY  Take prenatal vitamins as directed. The vitamin should contain 1 milligram of folic acid. Keep all vitamins out of reach of children. Only a couple vitamins or tablets containing iron may be fatal to a baby or young child when ingested.   Avoid use of all medications, including herbs, over-the-counter medications, not prescribed or suggested by your caregiver. Only take over-the-counter or prescription medicines for pain, discomfort, or fever as directed by your caregiver. Do not use aspirin.   Let your caregiver also know about herbs you may be using.   Alcohol is related to a number of birth defects. This includes fetal alcohol syndrome. All alcohol, in any form, should be avoided completely. Smoking will cause low birth rate and premature babies.   Street or illegal drugs are very harmful to the baby. They are absolutely forbidden. A baby born to an addicted mother will be addicted at birth. The baby will go through the same withdrawal an adult does.  SEEK MEDICAL CARE IF:  You have any concerns or worries during your pregnancy. It is better to call with  your questions if you feel they cannot wait, rather than worry about them. SEEK IMMEDIATE MEDICAL CARE IF:   An unexplained oral temperature above 102 F (38.9 C) develops, or as your caregiver suggests.   You have leaking of fluid from the vagina (birth canal). If leaking membranes are suspected, take your temperature and tell your caregiver of this when you call.   There   is vaginal spotting, bleeding, or passing clots. Tell your caregiver of the amount and how many pads are used. Light spotting in pregnancy is common, especially following intercourse.   You develop a bad smelling vaginal discharge with a change in the color from clear to white.   You continue to feel sick to your stomach (nauseated) and have no relief from remedies suggested. You vomit blood or coffee ground-like materials.   You lose more than 2 pounds of weight or gain more than 2 pounds of weight over 1 week, or as suggested by your caregiver.   You notice swelling of your face, hands, feet, or legs.   You get exposed to German measles and have never had them.   You are exposed to fifth disease or chickenpox.   You develop belly (abdominal) pain. Round ligament discomfort is a common non-cancerous (benign) cause of abdominal pain in pregnancy. Your caregiver still must evaluate you.   You develop a bad headache that does not go away.   You develop fever, diarrhea, pain with urination, or shortness of breath.   You develop visual problems, blurry, or double vision.   You fall or are in a car accident or any kind of trauma.   There is mental or physical violence at home.  Document Released: 12/10/2001 Document Revised: 08/28/2011 Document Reviewed: 06/14/2009 [employer] Patient Information 2012 [employer]. Breastfeeding BENEFITS OF BREASTFEEDING For the baby  The first milk (colostrum) helps the baby's digestive system function better.   There are antibodies from the mother in the milk that help the  baby fight off infections.   The baby has a lower incidence of asthma, allergies, and SIDS (sudden infant death syndrome).   The nutrients in breast milk are better than formulas for the baby and helps the baby's brain grow better.   Babies who breastfeed have less gas, colic, and constipation.  For the mother  Breastfeeding helps develop a very special bond between mother and baby.   It is more convenient, always available at the correct temperature and cheaper than formula feeding.   It burns calories in the mother and helps with losing weight that was gained during pregnancy.   It makes the uterus contract back down to normal size faster and slows bleeding following delivery.   Breastfeeding mothers have a lower risk of developing breast cancer.  NURSE FREQUENTLY  A healthy, full-term baby may breastfeed as often as every hour or space his or her feedings to every 3 hours.   How often to nurse will vary from baby to baby. Watch your baby for signs of hunger, not the clock.   Nurse as often as the baby requests, or when you feel the need to reduce the fullness of your breasts.   Awaken the baby if it has been 3 to 4 hours since the last feeding.   Frequent feeding will help the mother make more milk and will prevent problems like sore nipples and engorgement of the breasts.  BABY'S POSITION AT THE BREAST  Whether lying down or sitting, be sure that the baby's tummy is facing your tummy.   Support the breast with 4 fingers underneath the breast and the thumb above. Make sure your fingers are well away from the nipple and baby's mouth.   Stroke the baby's lips and cheek closest to the breast gently with your finger or nipple.   When the baby's mouth is open wide enough, place all of your nipple and as much   of the dark area around the nipple as possible into your baby's mouth.   Pull the baby in close so the tip of the nose and the baby's cheeks touch the breast during the  feeding.  FEEDINGS  The length of each feeding varies from baby to baby and from feeding to feeding.   The baby must suck about 2 to 3 minutes for your milk to get to him or her. This is called a "let down." For this reason, allow the baby to feed on each breast as long as he or she wants. Your baby will end the feeding when he or she has received the right balance of nutrients.   To break the suction, put your finger into the corner of the baby's mouth and slide it between his or her gums before removing your breast from his or her mouth. This will help prevent sore nipples.  REDUCING BREAST ENGORGEMENT  In the first week after your baby is born, you may experience signs of breast engorgement. When breasts are engorged, they feel heavy, warm, full, and may be tender to the touch. You can reduce engorgement if you:   Nurse frequently, every 2 to 3 hours. Mothers who breastfeed early and often have fewer problems with engorgement.   Place light ice packs on your breasts between feedings. This reduces swelling. Wrap the ice packs in a lightweight towel to protect your skin.   Apply moist hot packs to your breast for 5 to 10 minutes before each feeding. This increases circulation and helps the milk flow.   Gently massage your breast before and during the feeding.   Make sure that the baby empties at least one breast at every feeding before switching sides.   Use a breast pump to empty the breasts if your baby is sleepy or not nursing well. You may also want to pump if you are returning to work or or you feel you are getting engorged.   Avoid bottle feeds, pacifiers or supplemental feedings of water or juice in place of breastfeeding.   Be sure the baby is latched on and positioned properly while breastfeeding.   Prevent fatigue, stress, and anemia.   Wear a supportive bra, avoiding underwire styles.   Eat a balanced diet with enough fluids.  If you follow these suggestions, your  engorgement should improve in 24 to 48 hours. If you are still experiencing difficulty, call your lactation consultant or caregiver. IS MY BABY GETTING ENOUGH MILK? Sometimes, mothers worry about whether their babies are getting enough milk. You can be assured that your baby is getting enough milk if:  The baby is actively sucking and you hear swallowing.   The baby nurses at least 8 to 12 times in a 24 hour time period. Nurse your baby until he or she unlatches or falls asleep at the first breast (at least 10 to 20 minutes), then offer the second side.   The baby is wetting 5 to 6 disposable diapers (6 to 8 cloth diapers) in a 24 hour period by 5 to 6 days of age.   The baby is having at least 2 to 3 stools every 24 hours for the first few months. Breast milk is all the food your baby needs. It is not necessary for your baby to have water or formula. In fact, to help your breasts make more milk, it is best not to give your baby supplemental feedings during the early weeks.   The stool should   be soft and yellow.   The baby should gain 4 to 7 ounces per week after he is 4 days old.  TAKE CARE OF YOURSELF Take care of your breasts by:  Bathing or showering daily.   Avoiding the use of soaps on your nipples.   Start feedings on your left breast at one feeding and on your right breast at the next feeding.   You will notice an increase in your milk supply 2 to 5 days after delivery. You may feel some discomfort from engorgement, which makes your breasts very firm and often tender. Engorgement "peaks" out within 24 to 48 hours. In the meantime, apply warm moist towels to your breasts for 5 to 10 minutes before feeding. Gentle massage and expression of some milk before feeding will soften your breasts, making it easier for your baby to latch on. Wear a well fitting nursing bra and air dry your nipples for 10 to 15 minutes after each feeding.   Only use cotton bra pads.   Only use pure lanolin on  your nipples after nursing. You do not need to wash it off before nursing.  Take care of yourself by:   Eating well-balanced meals and nutritious snacks.   Drinking milk, fruit juice, and water to satisfy your thirst (about 8 glasses a day).   Getting plenty of rest.   Increasing calcium in your diet (1200 mg a day).   Avoiding foods that you notice affect the baby in a bad way.  SEEK MEDICAL CARE IF:   You have any questions or difficulty with breastfeeding.   You need help.   You have a hard, red, sore area on your breast, accompanied by a fever of 100.5 F (38.1 C) or more.   Your baby is too sleepy to eat well or is having trouble sleeping.   Your baby is wetting less than 6 diapers per day, by 5 days of age.   Your baby's skin or white part of his or her eyes is more yellow than it was in the hospital.   You feel depressed.  Document Released: 12/16/2005 Document Revised: 08/28/2011 Document Reviewed: 07/31/2009 [employer] Patient Information 2012 [employer]. 

## 2012-02-20 NOTE — Progress Notes (Signed)
No s/sx's of PTL, BP is good today. 28 wk labs next visit.

## 2012-03-05 ENCOUNTER — Ambulatory Visit (INDEPENDENT_AMBULATORY_CARE_PROVIDER_SITE_OTHER): Payer: Medicaid Other | Admitting: Physician Assistant

## 2012-03-05 VITALS — BP 131/80 | Temp 98.5°F | Wt 208.8 lb

## 2012-03-05 DIAGNOSIS — O3430 Maternal care for cervical incompetence, unspecified trimester: Secondary | ICD-10-CM

## 2012-03-05 DIAGNOSIS — N883 Incompetence of cervix uteri: Secondary | ICD-10-CM

## 2012-03-05 LAB — POCT URINALYSIS DIP (DEVICE)
Bilirubin Urine: NEGATIVE
Ketones, ur: NEGATIVE mg/dL
Leukocytes, UA: NEGATIVE
Specific Gravity, Urine: 1.025 (ref 1.005–1.030)
pH: 6 (ref 5.0–8.0)

## 2012-03-05 LAB — CBC
HCT: 36.2 % (ref 36.0–46.0)
Hemoglobin: 11.9 g/dL — ABNORMAL LOW (ref 12.0–15.0)
RBC: 3.92 MIL/uL (ref 3.87–5.11)

## 2012-03-05 NOTE — Patient Instructions (Signed)

## 2012-03-05 NOTE — Progress Notes (Signed)
No complaints. Discussed pelvic rest until cerclage removed. No PTL s/s or change in discharge. [place] scheduled for 28 weeks. Glucola today

## 2012-03-05 NOTE — Progress Notes (Signed)
U/S scheduled March 21. 2013 at 945 am.

## 2012-03-05 NOTE — Progress Notes (Signed)
Pulse- 96  Pain- right butt pain.  Edema- ankles

## 2012-03-06 LAB — GLUCOSE TOLERANCE, 1 HOUR: Glucose, 1 Hour GTT: 112 mg/dL (ref 70–140)

## 2012-03-06 LAB — RPR

## 2012-03-06 LAB — HIV ANTIBODY (ROUTINE TESTING W REFLEX): HIV: NONREACTIVE

## 2012-03-19 ENCOUNTER — Ambulatory Visit (HOSPITAL_COMMUNITY)
Admission: RE | Admit: 2012-03-19 | Discharge: 2012-03-19 | Disposition: A | Discharge status: Home / Self Care | Payer: Medicaid Other | Source: Ambulatory Visit | Attending: Physician Assistant | Admitting: Physician Assistant

## 2012-03-19 ENCOUNTER — Other Ambulatory Visit: Payer: Self-pay | Admitting: Physician Assistant

## 2012-03-19 ENCOUNTER — Ambulatory Visit (INDEPENDENT_AMBULATORY_CARE_PROVIDER_SITE_OTHER): Payer: Medicaid Other | Admitting: Family Medicine

## 2012-03-19 VITALS — BP 138/85 | Temp 98.5°F | Wt 210.3 lb

## 2012-03-19 DIAGNOSIS — O1210 Gestational proteinuria, unspecified trimester: Secondary | ICD-10-CM

## 2012-03-19 DIAGNOSIS — N289 Disorder of kidney and ureter, unspecified: Secondary | ICD-10-CM

## 2012-03-19 DIAGNOSIS — O26839 Pregnancy related renal disease, unspecified trimester: Secondary | ICD-10-CM

## 2012-03-19 DIAGNOSIS — O3430 Maternal care for cervical incompetence, unspecified trimester: Secondary | ICD-10-CM | POA: Insufficient documentation

## 2012-03-19 DIAGNOSIS — O09299 Supervision of pregnancy with other poor reproductive or obstetric history, unspecified trimester: Secondary | ICD-10-CM | POA: Insufficient documentation

## 2012-03-19 DIAGNOSIS — J45909 Unspecified asthma, uncomplicated: Secondary | ICD-10-CM

## 2012-03-19 DIAGNOSIS — I10 Essential (primary) hypertension: Secondary | ICD-10-CM

## 2012-03-19 DIAGNOSIS — O10019 Pre-existing essential hypertension complicating pregnancy, unspecified trimester: Secondary | ICD-10-CM

## 2012-03-19 DIAGNOSIS — N883 Incompetence of cervix uteri: Secondary | ICD-10-CM

## 2012-03-19 DIAGNOSIS — R809 Proteinuria, unspecified: Secondary | ICD-10-CM

## 2012-03-19 LAB — CBC
HCT: 36.6 % (ref 36.0–46.0)
MCH: 30.1 pg (ref 26.0–34.0)
MCV: 91.7 fL (ref 78.0–100.0)
RBC: 3.99 MIL/uL (ref 3.87–5.11)
WBC: 6.2 10*3/uL (ref 4.0–10.5)

## 2012-03-19 LAB — COMPREHENSIVE METABOLIC PANEL
BUN: 10 mg/dL (ref 6–23)
CO2: 19 mEq/L (ref 19–32)
Calcium: 8.2 mg/dL — ABNORMAL LOW (ref 8.4–10.5)
Chloride: 107 mEq/L (ref 96–112)
Creat: 0.55 mg/dL (ref 0.50–1.10)
Glucose, Bld: 98 mg/dL (ref 70–99)
Total Bilirubin: 0.4 mg/dL (ref 0.3–1.2)

## 2012-03-19 LAB — POCT URINALYSIS DIP (DEVICE)
Bilirubin Urine: NEGATIVE
Hgb urine dipstick: NEGATIVE
Nitrite: NEGATIVE
Protein, ur: 30 mg/dL — AB
Urobilinogen, UA: 1 mg/dL (ref 0.0–1.0)
pH: 6 (ref 5.0–8.0)

## 2012-03-19 MED ORDER — BECLOMETHASONE DIPROPIONATE 40 MCG/ACT IN AERS: 2.0000 | INHALATION_SPRAY | Freq: Two times a day (BID) | RESPIRATORY_TRACT | Status: DC

## 2012-03-19 NOTE — Progress Notes (Signed)
Edema-hands, feet and ankles. Pt states having nausea and headaches. No vaginal discharge. Pulse 94.

## 2012-03-19 NOTE — Patient Instructions (Signed)

## 2012-03-19 NOTE — Progress Notes (Signed)
Having headaches daily and vision changes.  BP ok, but will check labs and 24 hour urine.  PIH precautions.

## 2012-04-02 ENCOUNTER — Ambulatory Visit (INDEPENDENT_AMBULATORY_CARE_PROVIDER_SITE_OTHER): Payer: Medicaid Other | Admitting: Family Medicine

## 2012-04-02 VITALS — BP 124/86 | Temp 98.7°F | Wt 209.2 lb

## 2012-04-02 DIAGNOSIS — R809 Proteinuria, unspecified: Secondary | ICD-10-CM

## 2012-04-02 DIAGNOSIS — O1210 Gestational proteinuria, unspecified trimester: Secondary | ICD-10-CM

## 2012-04-02 DIAGNOSIS — O26839 Pregnancy related renal disease, unspecified trimester: Secondary | ICD-10-CM

## 2012-04-02 DIAGNOSIS — N289 Disorder of kidney and ureter, unspecified: Secondary | ICD-10-CM

## 2012-04-02 LAB — POCT URINALYSIS DIP (DEVICE)
Leukocytes, UA: NEGATIVE
Protein, ur: 30 mg/dL — AB
pH: 6 (ref 5.0–8.0)

## 2012-04-02 LAB — COMPREHENSIVE METABOLIC PANEL
AST: 16 U/L (ref 0–37)
Albumin: 3.4 g/dL — ABNORMAL LOW (ref 3.5–5.2)
Alkaline Phosphatase: 80 U/L (ref 39–117)
BUN: 10 mg/dL (ref 6–23)
Creat: 0.62 mg/dL (ref 0.50–1.10)
Potassium: 3.6 mEq/L (ref 3.5–5.3)
Total Bilirubin: 0.3 mg/dL (ref 0.3–1.2)

## 2012-04-02 LAB — CBC
HCT: 35.3 % — ABNORMAL LOW (ref 36.0–46.0)
MCHC: 32.6 g/dL (ref 30.0–36.0)
RDW: 13.2 % (ref 11.5–15.5)

## 2012-04-02 NOTE — Progress Notes (Signed)
Pulse 91 Pt returned 24 hour urine today.

## 2012-04-02 NOTE — Patient Instructions (Addendum)
Pregnancy and Travel Most pregnant woman can safely travel until the last month of the pregnancy. If you are planning to travel while pregnant, the best time is between 14 and 28 weeks of the pregnancy. During this period of the pregnancy, morning sickness should be minimal and very few problems should develop. Here are a few things to remember when traveling:  Get a copy of your medical records and take them with you.   Limit or avoid travel if you have medical problems or problems with your pregnancy.   Discuss any trip with your caregiver and get examined shortly before leaving.   Get a good night's sleep before leaving.   Take your pillow with you, if there is room in your luggage.   Try to get names of doctors in the area you will be visiting.   Wear flat, comfortable shoes.   Rest when at your destination.   Eat a balanced diet, take your vitamins and supplements, and drink a lot of fluids.   Do not wear yourself out.   Do not ride on a motorcycle when pregnant.  TRAVEL BY CAR  Wear your seat belt properly.   Sit as far away from the dashboard, if you are in the front seat, to avoid getting hit hard if the air bag deploys in an accident.   Stop about every 2 hours to use the restroom and walk around. This helps the circulation in your legs.   Keep water, crackers, and fruit in the car.   Do not travel for more than 6 hours a day.   Take a charged cell phone with you.  TRAVEL BY BUS You are more confined in a bus making it hard to walk around every couple of hours.  Get out and walk around if and when the bus stops.   Move your arms and legs when seated. This helps with your circulation.   Take water, crackers, and fruit with you.   Most buses traveling long distances will have a restroom. Ask about that when making your reservation.   After a long trip, lie down for 30 or more minutes with your feet slightly raised.   Take a charged cell phone with you.    TRAVEL BY TRAIN  There usually is more than one restroom.   There is a dining room for meals.   There usually are sleepers for overnight and long trips. Ask about sleepers when making your reservation.   It is a good idea to lie down for 30 minutes with your feet elevated after your trip.   Take a charged cell phone with you.  TRAVEL BY PLANE  Pregnant women may be restricted from flying after a certain time of the pregnancy. Every airline has its own rules and regulations. You should ask about them when making your reservation.   Usually there are only 2 or 3 restrooms available for 150 or more passengers.   You should not fly above 7000 feet in an unpressurized plane.   You cannot get up and walk around freely.   Wear your seatbelt at all times.   Put all your medications and medical records in your carry-on bag.   Take water, crackers, and fruit with you.   Try to get a bulkhead or an aisle seat.   Avoid caffeine drinks and do not eat a big meal before flying.   There are special meals you can order when making your reservation, if the airline is serving   meals.   Wear layered clothing because the temperature in the cabin can change.   Move your arms and legs while sitting to help with your circulation.   After a long trip, lie down for 30 or more minutes with your feet slightly elevated.   Take a charged cell phone with you.  TRAVEL BY CRUISE SHIP  You should check with the cruise line and ask several questions, such as:   Are pregnant women allowed on the cruise?   Is there a medical facility and doctor on board?   Does the ship dock in cities where there are doctors and medical facilities?   Rest and lie down with your feet raised for 30 or more minutes after arriving.   Take a charged cell phone with you.   Ask your caregiver if:   Any medications are safe for you to take if you get seasick.   It is safe for you to wear acupressure wristbands to  prevent getting seasick.  TRAVEL TO A FOREIGN COUNTRY  Discuss the trip with your caregiver.   Get copies of your medical records and have them with you at all times.   Take your medications and important documents with you in your carry-on bag.   Ask your caregiver if there are any medications that are safe to take for diarrhea, constipation, nausea, or vomiting.   Check to be sure you have the required vaccinations for entry into that country. The Center for Disease Control and Prevention (CDC) can advise you [phone]) about vaccination requirements.   Ask your medical insurance company if you and your baby will have insurance coverage while traveling outside the country. Emergency medical and evacuation insurance information is available at the following website: www.travel.guard.com   Before making plans to go to a foreign country, call the [employer] for the name of the [employer] to get information regarding the embassy's location, weather, any prevalent disease problems, crime, and other questions you might have.   It is a good idea to make copies of your medical records, passport, and other important papers, in case you lose the originals.   Do not eat uncooked foods of any kind.   Drink bottled water and do not use ice.   Wash fruits and vegetables with soapy hot water.   Only drink pasteurized milk.   After you arrive, ask for the location of doctors and hospitals.   After arriving, lie down for 30 minutes or more and get some rest.   Your cell phone may not work in many foreign countries. Ask your mobile phone carrier about cell phone coverage.  Document Released: 11/28/2008 Document Revised: 12/05/2011 Document Reviewed: 11/28/2008 [facility] Patient Information 2012 [place], [place]. Pregnancy - Third Trimester The third trimester of pregnancy (the last 3 months) is a period of the most rapid growth for you and your baby. The baby approaches a  length of 20 inches and a weight of 6 to 10 pounds. The baby is adding on fat and getting ready for life outside your body. While inside, babies have periods of sleeping and waking, suck their thumbs, and hiccups. You can often feel small contractions of the uterus. This is false labor. It is also called Braxton-Hicks contractions. This is like a practice for labor. The usual problems in this stage of pregnancy include more difficulty breathing, swelling of the hands and feet from water retention, and having to urinate more often because of the uterus and baby pressing on  your bladder.  PRENATAL EXAMS  Blood work may continue to be done during prenatal exams. These tests are done to check on your health and the probable health of your baby. Blood work is used to follow your blood levels (hemoglobin). Anemia (low hemoglobin) is common during pregnancy. Iron and vitamins are given to help prevent this. You may also continue to be checked for diabetes. Some of the past blood tests may be done again.   The size of the uterus is measured during each visit. This makes sure your baby is growing properly according to your pregnancy dates.   Your blood pressure is checked every prenatal visit. This is to make sure you are not getting toxemia.   Your urine is checked every prenatal visit for infection, diabetes and protein.   Your weight is checked at each visit. This is done to make sure gains are happening at the suggested rate and that you and your baby are growing normally.   Sometimes, an ultrasound is performed to confirm the position and the proper growth and development of the baby. This is a test done that bounces harmless sound waves off the baby so your caregiver can more accurately determine due dates.   Discuss the type of pain medication and anesthesia you will have during your labor and delivery.   Discuss the possibility and anesthesia if a Cesarean Section might be necessary.   Inform your  caregiver if there is any mental or physical violence at home.  Sometimes, a specialized non-stress test, contraction stress test and biophysical profile are done to make sure the baby is not having a problem. Checking the amniotic fluid surrounding the baby is called an amniocentesis. The amniotic fluid is removed by sticking a needle into the belly (abdomen). This is sometimes done near the end of pregnancy if an early delivery is required. In this case, it is done to help make sure the baby's lungs are mature enough for the baby to live outside of the womb. If the lungs are not mature and it is unsafe to deliver the baby, an injection of cortisone medication is given to the mother 1 to 2 days before the delivery. This helps the baby's lungs mature and makes it safer to deliver the baby. CHANGES OCCURING IN THE THIRD TRIMESTER OF PREGNANCY Your body goes through many changes during pregnancy. They vary from person to person. Talk to your caregiver about changes you notice and are concerned about.  During the last trimester, you have probably had an increase in your appetite. It is normal to have cravings for certain foods. This varies from person to person and pregnancy to pregnancy.   You may begin to get stretch marks on your hips, abdomen, and breasts. These are normal changes in the body during pregnancy. There are no exercises or medications to take which prevent this change.   Constipation may be treated with a stool softener or adding bulk to your diet. Drinking lots of fluids, fiber in vegetables, fruits, and whole grains are helpful.   Exercising is also helpful. If you have been very active up until your pregnancy, most of these activities can be continued during your pregnancy. If you have been less active, it is helpful to start an exercise program such as walking. Consult your caregiver before starting exercise programs.   Avoid all smoking, alcohol, un-prescribed drugs, herbs and "street  drugs" during your pregnancy. These chemicals affect the formation and growth of the baby. Avoid chemicals  throughout the pregnancy to ensure the delivery of a healthy infant.   Backache, varicose veins and hemorrhoids may develop or get worse.   You will tire more easily in the third trimester, which is normal.   The baby's movements may be stronger and more often.   You may become short of breath easily.   Your belly button may stick out.   A yellow discharge may leak from your breasts called colostrum.   You may have a bloody mucus discharge. This usually occurs a few days to a week before labor begins.  HOME CARE INSTRUCTIONS   Keep your caregiver's appointments. Follow your caregiver's instructions regarding medication use, exercise, and diet.   During pregnancy, you are providing food for you and your baby. Continue to eat regular, well-balanced meals. Choose foods such as meat, fish, milk and other low fat dairy products, vegetables, fruits, and whole-grain breads and cereals. Your caregiver will tell you of the ideal weight gain.   A physical sexual relationship may be continued throughout pregnancy if there are no other problems such as early (premature) leaking of amniotic fluid from the membranes, vaginal bleeding, or belly (abdominal) pain.   Exercise regularly if there are no restrictions. Check with your caregiver if you are unsure of the safety of your exercises. Greater weight gain will occur in the last 2 trimesters of pregnancy. Exercising helps:   Control your weight.   Get you in shape for labor and delivery.   You lose weight after you deliver.   Rest a lot with legs elevated, or as needed for leg cramps or low back pain.   Wear a good support or jogging bra for breast tenderness during pregnancy. This may help if worn during sleep. Pads or tissues may be used in the bra if you are leaking colostrum.   Do not use hot tubs, steam rooms, or saunas.   Wear your  seat belt when driving. This protects you and your baby if you are in an accident.   Avoid raw meat, cat litter boxes and soil used by cats. These carry germs that can cause birth defects in the baby.   It is easier to loose urine during pregnancy. Tightening up and strengthening the pelvic muscles will help with this problem. You can practice stopping your urination while you are going to the bathroom. These are the same muscles you need to strengthen. It is also the muscles you would use if you were trying to stop from passing gas. You can practice tightening these muscles up 10 times a set and repeating this about 3 times per day. Once you know what muscles to tighten up, do not perform these exercises during urination. It is more likely to cause an infection by backing up the urine.   Ask for help if you have financial, counseling or nutritional needs during pregnancy. Your caregiver will be able to offer counseling for these needs as well as refer you for other special needs.   Make a list of emergency phone numbers and have them available.   Plan on getting help from family or friends when you go home from the hospital.   Make a trial run to the hospital.   Take prenatal classes with the father to understand, practice and ask questions about the labor and delivery.   Prepare the baby's room/nursery.   Do not travel out of the city unless it is absolutely necessary and with the advice of your caregiver.  Wear only low or no heal shoes to have better balance and prevent falling.  MEDICATIONS AND DRUG USE IN PREGNANCY  Take prenatal vitamins as directed. The vitamin should contain 1 milligram of folic acid. Keep all vitamins out of reach of children. Only a couple vitamins or tablets containing iron may be fatal to a baby or young child when ingested.   Avoid use of all medications, including herbs, over-the-counter medications, not prescribed or suggested by your caregiver. Only take  over-the-counter or prescription medicines for pain, discomfort, or fever as directed by your caregiver. Do not use aspirin, ibuprofen (Motrin, Advil, Nuprin) or naproxen (Aleve) unless OK'd by your caregiver.   Let your caregiver also know about herbs you may be using.   Alcohol is related to a number of birth defects. This includes fetal alcohol syndrome. All alcohol, in any form, should be avoided completely. Smoking will cause low birth rate and premature babies.   Street/illegal drugs are very harmful to the baby. They are absolutely forbidden. A baby born to an addicted mother will be addicted at birth. The baby will go through the same withdrawal an adult does.  SEEK MEDICAL CARE IF: You have any concerns or worries during your pregnancy. It is better to call with your questions if you feel they cannot wait, rather than worry about them. DECISIONS ABOUT CIRCUMCISION You may or may not know the sex of your baby. If you know your baby is a boy, it may be time to think about circumcision. Circumcision is the removal of the foreskin of the penis. This is the skin that covers the sensitive end of the penis. There is no proven medical need for this. Often this decision is made on what is popular at the time or based upon religious beliefs and social issues. You can discuss these issues with your caregiver or pediatrician. SEEK IMMEDIATE MEDICAL CARE IF:   An unexplained oral temperature above 102 F (38.9 C) develops, or as your caregiver suggests.   You have leaking of fluid from the vagina (birth canal). If leaking membranes are suspected, take your temperature and tell your caregiver of this when you call.   There is vaginal spotting, bleeding or passing clots. Tell your caregiver of the amount and how many pads are used.   You develop a bad smelling vaginal discharge with a change in the color from clear to white.   You develop vomiting that lasts more than 24 hours.   You develop  chills or fever.   You develop shortness of breath.   You develop burning on urination.   You loose more than 2 pounds of weight or gain more than 2 pounds of weight or as suggested by your caregiver.   You notice sudden swelling of your face, hands, and feet or legs.   You develop belly (abdominal) pain. Round ligament discomfort is a common non-cancerous (benign) cause of abdominal pain in pregnancy. Your caregiver still must evaluate you.   You develop a severe headache that does not go away.   You develop visual problems, blurred or double vision.   If you have not felt your baby move for more than 1 hour. If you think the baby is not moving as much as usual, eat something with sugar in it and lie down on your left side for an hour. The baby should move at least 4 to 5 times per hour. Call right away if your baby moves less than that.  You fall, are in a car accident or any kind of trauma.   There is mental or physical violence at home.  Document Released: 12/10/2001 Document Revised: 12/05/2011 Document Reviewed: 06/14/2009 [facility] Patient Information 2012 [place], [place]. Breastfeeding BENEFITS OF BREASTFEEDING For the baby  The first milk (colostrum) helps the baby's digestive system function better.   There are antibodies from the mother in the milk that help the baby fight off infections.   The baby has a lower incidence of asthma, allergies, and SIDS (sudden infant death syndrome).   The nutrients in breast milk are better than formulas for the baby and helps the baby's brain grow better.   Babies who breastfeed have less gas, colic, and constipation.  For the mother  Breastfeeding helps develop a very special bond between mother and baby.   It is more convenient, always available at the correct temperature and cheaper than formula feeding.   It burns calories in the mother and helps with losing weight that was gained during pregnancy.   It makes the uterus  contract back down to normal size faster and slows bleeding following delivery.   Breastfeeding mothers have a lower risk of developing breast cancer.  NURSE FREQUENTLY  A healthy, full-term baby may breastfeed as often as every hour or space his or her feedings to every 3 hours.   How often to nurse will vary from baby to baby. Watch your baby for signs of hunger, not the clock.   Nurse as often as the baby requests, or when you feel the need to reduce the fullness of your breasts.   Awaken the baby if it has been 3 to 4 hours since the last feeding.   Frequent feeding will help the mother make more milk and will prevent problems like sore nipples and engorgement of the breasts.  BABY'S POSITION AT THE BREAST  Whether lying down or sitting, be sure that the baby's tummy is facing your tummy.   Support the breast with 4 fingers underneath the breast and the thumb above. Make sure your fingers are well away from the nipple and baby's mouth.   Stroke the baby's lips and cheek closest to the breast gently with your finger or nipple.   When the baby's mouth is open wide enough, place all of your nipple and as much of the dark area around the nipple as possible into your baby's mouth.   Pull the baby in close so the tip of the nose and the baby's cheeks touch the breast during the feeding.  FEEDINGS  The length of each feeding varies from baby to baby and from feeding to feeding.   The baby must suck about 2 to 3 minutes for your milk to get to him or her. This is called a "let down." For this reason, allow the baby to feed on each breast as long as he or she wants. Your baby will end the feeding when he or she has received the right balance of nutrients.   To break the suction, put your finger into the corner of the baby's mouth and slide it between his or her gums before removing your breast from his or her mouth. This will help prevent sore nipples.  REDUCING BREAST ENGORGEMENT  In  the first week after your baby is born, you may experience signs of breast engorgement. When breasts are engorged, they feel heavy, warm, full, and may be tender to the touch. You can reduce engorgement if you:  Nurse frequently, every 2 to 3 hours. Mothers who breastfeed early and often have fewer problems with engorgement.   Place light ice packs on your breasts between feedings. This reduces swelling. Wrap the ice packs in a lightweight towel to protect your skin.   Apply moist hot packs to your breast for 5 to 10 minutes before each feeding. This increases circulation and helps the milk flow.   Gently massage your breast before and during the feeding.   Make sure that the baby empties at least one breast at every feeding before switching sides.   Use a breast pump to empty the breasts if your baby is sleepy or not nursing well. You may also want to pump if you are returning to work or or you feel you are getting engorged.   Avoid bottle feeds, pacifiers or supplemental feedings of water or juice in place of breastfeeding.   Be sure the baby is latched on and positioned properly while breastfeeding.   Prevent fatigue, stress, and anemia.   Wear a supportive bra, avoiding underwire styles.   Eat a balanced diet with enough fluids.  If you follow these suggestions, your engorgement should improve in 24 to 48 hours. If you are still experiencing difficulty, call your lactation consultant or caregiver. IS MY BABY GETTING ENOUGH MILK? Sometimes, mothers worry about whether their babies are getting enough milk. You can be assured that your baby is getting enough milk if:  The baby is actively sucking and you hear swallowing.   The baby nurses at least 8 to 12 times in a 24 hour time period. Nurse your baby until he or she unlatches or falls asleep at the first breast (at least 10 to 20 minutes), then offer the second side.   The baby is wetting 5 to 6 disposable diapers (6 to 8 cloth  diapers) in a 24 hour period by 46 to 82 days of age.   The baby is having at least 2 to 3 stools every 24 hours for the first few months. Breast milk is all the food your baby needs. It is not necessary for your baby to have water or formula. In fact, to help your breasts make more milk, it is best not to give your baby supplemental feedings during the early weeks.   The stool should be soft and yellow.   The baby should gain 4 to 7 ounces per week after he is 44 days old.  TAKE CARE OF YOURSELF Take care of your breasts by:  Bathing or showering daily.   Avoiding the use of soaps on your nipples.   Start feedings on your left breast at one feeding and on your right breast at the next feeding.   You will notice an increase in your milk supply 2 to 5 days after delivery. You may feel some discomfort from engorgement, which makes your breasts very firm and often tender. Engorgement "peaks" out within 24 to 48 hours. In the meantime, apply warm moist towels to your breasts for 5 to 10 minutes before feeding. Gentle massage and expression of some milk before feeding will soften your breasts, making it easier for your baby to latch on. Wear a well fitting nursing bra and air dry your nipples for 10 to 15 minutes after each feeding.   Only use cotton bra pads.   Only use pure lanolin on your nipples after nursing. You do not need to wash it off before nursing.  Take care of yourself  by:   Eating well-balanced meals and nutritious snacks.   Drinking milk, fruit juice, and water to satisfy your thirst (about 8 glasses a day).   Getting plenty of rest.   Increasing calcium in your diet (1200 mg a day).   Avoiding foods that you notice affect the baby in a bad way.  SEEK MEDICAL CARE IF:   You have any questions or difficulty with breastfeeding.   You need help.   You have a hard, red, sore area on your breast, accompanied by a fever of 100.5 F (38.1 C) or more.   Your baby is too  sleepy to eat well or is having trouble sleeping.   Your baby is wetting less than 6 diapers per day, by 42 days of age.   Your baby's skin or white part of his or her eyes is more yellow than it was in the hospital.   You feel depressed.  Document Released: 12/16/2005 Document Revised: 12/05/2011 Document Reviewed: 07/31/2009 [facility] Patient Information 2012 [place], [place].

## 2012-04-02 NOTE — Progress Notes (Signed)
24 hour urine and labs today Last u/s 3/21 looks good 58%, cx was 4.8 cm--start 2x/wk testing next visit.

## 2012-04-08 LAB — CREATININE CLEARANCE, URINE, 24 HOUR
Creatinine Clearance: 193 mL/min — ABNORMAL HIGH (ref 75–115)
Creatinine, 24H Ur: 1719 mg/d (ref 700–1800)
Creatinine, Urine: 156.3 mg/dL
Creatinine: 0.62 mg/dL (ref 0.50–1.10)

## 2012-04-08 LAB — PROTEIN, URINE, 24 HOUR: Protein, Urine: 19 mg/dL

## 2012-04-16 ENCOUNTER — Encounter: Payer: Self-pay | Admitting: Obstetrics and Gynecology

## 2012-04-16 ENCOUNTER — Ambulatory Visit (INDEPENDENT_AMBULATORY_CARE_PROVIDER_SITE_OTHER): Payer: Medicaid Other | Admitting: Obstetrics and Gynecology

## 2012-04-16 VITALS — BP 127/88 | Temp 97.4°F | Wt 206.6 lb

## 2012-04-16 DIAGNOSIS — N289 Disorder of kidney and ureter, unspecified: Secondary | ICD-10-CM

## 2012-04-16 DIAGNOSIS — O3430 Maternal care for cervical incompetence, unspecified trimester: Secondary | ICD-10-CM

## 2012-04-16 DIAGNOSIS — O1210 Gestational proteinuria, unspecified trimester: Secondary | ICD-10-CM

## 2012-04-16 DIAGNOSIS — O26839 Pregnancy related renal disease, unspecified trimester: Secondary | ICD-10-CM

## 2012-04-16 DIAGNOSIS — R809 Proteinuria, unspecified: Secondary | ICD-10-CM

## 2012-04-16 DIAGNOSIS — O10019 Pre-existing essential hypertension complicating pregnancy, unspecified trimester: Secondary | ICD-10-CM

## 2012-04-16 DIAGNOSIS — N883 Incompetence of cervix uteri: Secondary | ICD-10-CM

## 2012-04-16 LAB — POCT URINALYSIS DIP (DEVICE)
Bilirubin Urine: NEGATIVE
Glucose, UA: NEGATIVE mg/dL
Ketones, ur: 15 mg/dL — AB
Nitrite: NEGATIVE
Protein, ur: 30 mg/dL — AB
Specific Gravity, Urine: 1.025 (ref 1.005–1.030)
Urobilinogen, UA: 1 mg/dL (ref 0.0–1.0)
pH: 6 (ref 5.0–8.0)

## 2012-04-16 NOTE — Progress Notes (Signed)
Pt reports that she is having some "wet" discharge. No color and no odor.

## 2012-04-16 NOTE — Progress Notes (Signed)
Patient doing well, complaining of watery discharge. SSE: no pooling, neg nitrazine, presence of thin white discharge. Wet prep collected. Will schedule f/u growth ultrasound in 2 weeks. FM/PTL precautions reviewed. NSt reviewed- category I

## 2012-04-16 NOTE — Progress Notes (Signed)
Addended by: Jill Side on: 04/16/2012 03:51 PM   Modules accepted: Orders

## 2012-04-17 LAB — WET PREP, GENITAL

## 2012-04-20 ENCOUNTER — Encounter: Payer: Self-pay | Admitting: *Deleted

## 2012-04-20 ENCOUNTER — Ambulatory Visit (INDEPENDENT_AMBULATORY_CARE_PROVIDER_SITE_OTHER): Payer: Medicaid Other | Admitting: *Deleted

## 2012-04-20 VITALS — BP 120/78 | Wt 206.8 lb

## 2012-04-20 DIAGNOSIS — O10019 Pre-existing essential hypertension complicating pregnancy, unspecified trimester: Secondary | ICD-10-CM

## 2012-04-20 NOTE — Progress Notes (Signed)
P = 91   Pt denies H/A or visual changes.   BPP added to testing today per Dr. Jolayne Panther as pt is requesting to travel out of state and cannot have fetal testing again until 04/27/12.  Pt request honored as BPP is 10/10.  Pt understands to go to area hospital for decr. FM or sx of pre-eclampsia.

## 2012-04-20 NOTE — Progress Notes (Signed)
Addended by: Jill Side on: 04/20/2012 04:53 PM   Modules accepted: Level of Service

## 2012-04-22 NOTE — Progress Notes (Incomplete)
4/22 NST reviewed and reactive

## 2012-04-23 ENCOUNTER — Encounter: Payer: Medicaid Other | Admitting: Obstetrics and Gynecology

## 2012-04-27 ENCOUNTER — Ambulatory Visit (INDEPENDENT_AMBULATORY_CARE_PROVIDER_SITE_OTHER): Payer: Medicaid Other | Admitting: *Deleted

## 2012-04-27 VITALS — BP 126/69 | Wt 208.7 lb

## 2012-04-27 DIAGNOSIS — O10019 Pre-existing essential hypertension complicating pregnancy, unspecified trimester: Secondary | ICD-10-CM

## 2012-04-27 NOTE — Progress Notes (Signed)
P-84  

## 2012-04-30 ENCOUNTER — Ambulatory Visit (INDEPENDENT_AMBULATORY_CARE_PROVIDER_SITE_OTHER): Payer: Medicaid Other | Admitting: Obstetrics & Gynecology

## 2012-04-30 VITALS — BP 116/70 | Wt 207.0 lb

## 2012-04-30 DIAGNOSIS — O10019 Pre-existing essential hypertension complicating pregnancy, unspecified trimester: Secondary | ICD-10-CM

## 2012-04-30 DIAGNOSIS — O3430 Maternal care for cervical incompetence, unspecified trimester: Secondary | ICD-10-CM

## 2012-04-30 DIAGNOSIS — N883 Incompetence of cervix uteri: Secondary | ICD-10-CM

## 2012-04-30 LAB — POCT URINALYSIS DIP (DEVICE)
Bilirubin Urine: NEGATIVE
Glucose, UA: NEGATIVE mg/dL
Ketones, ur: NEGATIVE mg/dL
Specific Gravity, Urine: 1.02 (ref 1.005–1.030)

## 2012-04-30 NOTE — Progress Notes (Signed)
NST reactive. No problems.

## 2012-04-30 NOTE — Patient Instructions (Signed)

## 2012-04-30 NOTE — Progress Notes (Signed)
P= 80 

## 2012-04-30 NOTE — Progress Notes (Signed)
NST reactive on 04/27/12 

## 2012-05-04 ENCOUNTER — Ambulatory Visit (HOSPITAL_COMMUNITY)
Admission: RE | Admit: 2012-05-04 | Discharge: 2012-05-04 | Disposition: A | Discharge status: Home / Self Care | Payer: Medicaid Other | Source: Ambulatory Visit | Attending: Obstetrics and Gynecology | Admitting: Obstetrics and Gynecology

## 2012-05-04 ENCOUNTER — Ambulatory Visit (INDEPENDENT_AMBULATORY_CARE_PROVIDER_SITE_OTHER): Payer: Medicaid Other | Admitting: *Deleted

## 2012-05-04 VITALS — BP 126/79 | Wt 209.0 lb

## 2012-05-04 DIAGNOSIS — O10019 Pre-existing essential hypertension complicating pregnancy, unspecified trimester: Secondary | ICD-10-CM

## 2012-05-04 DIAGNOSIS — O139 Gestational [pregnancy-induced] hypertension without significant proteinuria, unspecified trimester: Secondary | ICD-10-CM | POA: Insufficient documentation

## 2012-05-04 DIAGNOSIS — O3430 Maternal care for cervical incompetence, unspecified trimester: Secondary | ICD-10-CM | POA: Insufficient documentation

## 2012-05-04 DIAGNOSIS — O09299 Supervision of pregnancy with other poor reproductive or obstetric history, unspecified trimester: Secondary | ICD-10-CM | POA: Insufficient documentation

## 2012-05-04 NOTE — Progress Notes (Signed)
NST 5/6 reactive

## 2012-05-04 NOTE — Progress Notes (Signed)
P = 76   [place] growth today

## 2012-05-06 ENCOUNTER — Inpatient Hospital Stay (HOSPITAL_COMMUNITY)
Admission: AD | Admit: 2012-05-06 | Discharge: 2012-05-06 | Disposition: A | Discharge status: Home / Self Care | Payer: Medicaid Other | Source: Ambulatory Visit | Attending: Obstetrics & Gynecology | Admitting: Obstetrics & Gynecology

## 2012-05-06 ENCOUNTER — Encounter (HOSPITAL_COMMUNITY): Payer: Self-pay | Admitting: *Deleted

## 2012-05-06 DIAGNOSIS — O3430 Maternal care for cervical incompetence, unspecified trimester: Secondary | ICD-10-CM | POA: Insufficient documentation

## 2012-05-06 DIAGNOSIS — N898 Other specified noninflammatory disorders of vagina: Secondary | ICD-10-CM

## 2012-05-06 DIAGNOSIS — N949 Unspecified condition associated with female genital organs and menstrual cycle: Secondary | ICD-10-CM | POA: Insufficient documentation

## 2012-05-06 DIAGNOSIS — O99891 Other specified diseases and conditions complicating pregnancy: Secondary | ICD-10-CM | POA: Insufficient documentation

## 2012-05-06 HISTORY — DX: Headache: R51

## 2012-05-06 LAB — URINALYSIS, ROUTINE W REFLEX MICROSCOPIC
Glucose, UA: NEGATIVE mg/dL
Leukocytes, UA: NEGATIVE
Nitrite: NEGATIVE
pH: 6 (ref 5.0–8.0)

## 2012-05-06 LAB — AMNISURE RUPTURE OF MEMBRANE (ROM) NOT AT ARMC: Amnisure ROM: NEGATIVE

## 2012-05-06 LAB — URINE MICROSCOPIC-ADD ON

## 2012-05-06 NOTE — Discharge Instructions (Signed)

## 2012-05-06 NOTE — MAU Note (Signed)
Pt states she had intercourse this morning around 0930 and felt a big gush of fluid about 20 min after intercourse.  Soaking bed and did not smell like urine.  Pt states she was able to urinate a large quantity after this episode.  Pt currently has a cerclage in place and denies any vaginal bleeding.

## 2012-05-06 NOTE — MAU Provider Note (Addendum)
History     CSN: [id number]  Arrival date and time: 05/06/12 1243   First Provider Initiated Contact with Patient 05/06/12 1308      Chief Complaint  Patient presents with  . Rupture of Membranes   HPI This is a 31 year old G6 P3 0-3 at 35 weeks and one-day with pregnancy complicated by cervical incompetence with cerclage placement who presents to the MAU with questionable ruptured membranes after sexual intercourse earlier today.  She felt a big gush of fluid since not had further leaking fluid. Denies decreased fetal activity, vaginal discharge, vaginal bleeding. She denies contractions.  OB History    Grav Para Term Preterm Abortions TAB SAB Ect Mult Living   6 3 3  2 1 1   3       Past Medical History  Diagnosis Date  . Ovarian cyst   . Hypertension     no medications-pt states b/p has been elevated for past 4 months at office visits  . Asthma     albuterol inhaler-not used in past 30 days  . Headache     Past Surgical History  Procedure Date  . Oophorectomy     left ovary and tube  . Tonsillectomy   . Vaginal delivery 1997, 2000. 2004  . Cervical cerclage 12/02/2011    Procedure: CERCLAGE CERVICAL;  Surgeon: Tereso Newcomer, MD;  Location: [facility];  Service: Gynecology;  Laterality: N/A;    Family History  Problem Relation Age of Onset  . Hypertension Father   . Anesthesia problems Mother     History  Substance Use Topics  . Smoking status: Former Smoker -- 4.0 packs/day for 10 years    Types: Cigarettes    Quit date: 10/02/2011  . Smokeless tobacco: Never Used  . Alcohol Use: No     occassionally    Allergies: No Known Allergies  Prescriptions prior to admission  Medication Sig Dispense Refill  . albuterol (PROVENTIL,VENTOLIN) 90 MCG/ACT inhaler Inhale 2 puffs into the lungs every 6 (six) hours as needed. Shortness of breath and wheezing  17 g  3  . beclomethasone (QVAR) 40 MCG/ACT inhaler Inhale 2 puffs into the lungs 2 (two) times daily.  1 Inhaler   12  . Prenatal Vit-Fe Fumarate-FA (GNP PRENATAL VITAMINS) 28-0.8 MG TABS Take 1 tablet by mouth.        . valACYclovir (VALTREX) 1000 MG tablet Take 1 tablet (1,000 mg total) by mouth 2 (two) times daily.  60 tablet  3    Review of Systems  All other systems reviewed and are negative.   Physical Exam   Blood pressure 133/83, pulse 106, temperature 98.9 F (37.2 C), temperature source Oral, resp. rate 20, height 5\' 7"  (1.702 m), weight 94.802 kg (209 lb), last menstrual period 09/03/2011, SpO2 99.00%.  Physical Exam  Constitutional: She is oriented to person, place, and time. She appears well-developed and well-nourished.  GI: Soft. She exhibits no distension and no mass. There is no tenderness. There is no rebound and no guarding.       Term sized uterus  Genitourinary:       Thin whitish watery discharge  Neurological: She is alert and oriented to person, place, and time.  Skin: Skin is warm and dry.  Psychiatric: She has a normal mood and affect. Her behavior is normal. Judgment and thought content normal.   Fern negative Amnisure negative  MAU Course  Procedures NST: category 1 tracing with accelerations.  No contractions;  MDM R/o  rupture of membranes  Assessment and Plan  1.  Vaginal discharge  No evidence of SROM.  Reassured patient.  Discharged patient to home.  F/u tomorrow in clinic.  Laquinton Bihm JEHIEL 05/06/2012, 1:24 PM

## 2012-05-07 ENCOUNTER — Ambulatory Visit (INDEPENDENT_AMBULATORY_CARE_PROVIDER_SITE_OTHER): Payer: Medicaid Other | Admitting: Obstetrics & Gynecology

## 2012-05-07 VITALS — BP 131/90 | Temp 98.9°F | Wt 207.5 lb

## 2012-05-07 DIAGNOSIS — O3430 Maternal care for cervical incompetence, unspecified trimester: Secondary | ICD-10-CM

## 2012-05-07 DIAGNOSIS — N289 Disorder of kidney and ureter, unspecified: Secondary | ICD-10-CM

## 2012-05-07 DIAGNOSIS — O26839 Pregnancy related renal disease, unspecified trimester: Secondary | ICD-10-CM

## 2012-05-07 DIAGNOSIS — O1210 Gestational proteinuria, unspecified trimester: Secondary | ICD-10-CM

## 2012-05-07 DIAGNOSIS — R809 Proteinuria, unspecified: Secondary | ICD-10-CM

## 2012-05-07 DIAGNOSIS — N883 Incompetence of cervix uteri: Secondary | ICD-10-CM

## 2012-05-07 DIAGNOSIS — O10019 Pre-existing essential hypertension complicating pregnancy, unspecified trimester: Secondary | ICD-10-CM

## 2012-05-07 NOTE — Progress Notes (Signed)
ROM r/o yesterday in MAU. No complaints. NST today reactive. Consider removal of cerclage 36-37 weeks.  [place] 5/6 nl growth, nl AFI, cephalic  24 hr urine, CBC, CMET

## 2012-05-07 NOTE — Patient Instructions (Signed)
Preeclampsia and Eclampsia  Preeclampsia is a condition of high blood pressure during pregnancy. It can happen at 20 weeks or later in pregnancy. If high blood pressure occurs in the second half of pregnancy with no other symptoms, it is called gestational hypertension and goes away after the baby is born. If any of the symptoms listed below develop with gestational hypertension, it is then called preeclampsia. Eclampsia (convulsions) may follow preeclampsia. This is one of the reasons for regular prenatal checkups. Early diagnosis and treatment are very important to prevent eclampsia.  CAUSES   There is no known cause of preeclampsia/eclampsia in pregnancy. There are several known conditions that may put the pregnant woman at risk, such as:   The first pregnancy.   Having preeclampsia in a past pregnancy.   Having lasting (chronic) high blood pressure.   Having multiples (twins, triplets).   Being age 31 or older.   African American ethnic background.   Having kidney disease or diabetes.   Medical conditions such as lupus or blood diseases.   Being overweight (obese).  SYMPTOMS    High blood pressure.   Headaches.   Sudden weight gain.   Swelling of hands, face, legs, and feet.   Protein in the urine.   Feeling sick to your stomach (nauseous) and throwing up (vomiting).   Vision problems (blurred or double vision).   Numbness in the face, arms, legs, and feet.   Dizziness.   Slurred speech.   Preeclampsia can cause growth retardation in the fetus.   Separation (abruption) of the placenta.   Not enough fluid in the amniotic sac (oligohydramnios).   Sensitivity to bright lights.   Belly (abdominal) pain.  DIAGNOSIS   If protein is found in the urine in the second half of pregnancy, this is considered preeclampsia. Other symptoms mentioned above may also be present.  TREATMENT   It is necessary to treat this.   Your caregiver may prescribe bed rest early in this condition. Plenty of rest and  salt restriction may be all that is needed.   Medicines may be necessary to lower blood pressure if the condition does not respond to more conservative measures.   In more severe cases, hospitalization may be needed:   For treatment of blood pressure.   To control fluid retention.   To monitor the baby to see if the condition is causing harm to the baby.   Hospitalization is the best way to treat the first sign of preeclampsia. This is so the mother and baby can be watched closely and blood tests can be done effectively and correctly.   If the condition becomes severe, it may be necessary to induce labor or to remove the infant by surgical means (cesarean section). The best cure for preeclampsia/eclampsia is to deliver the baby.  Preeclampsia and eclampsia involve risks to mother and infant. Your caregiver will discuss these risks with you. Together, you can work out the best possible approach to your problems. Make sure you keep your prenatal visits as scheduled. Not keeping appointments could result in a chronic or permanent injury, pain, disability to you, and death or injury to you or your unborn baby. If there is any problem keeping the appointment, you must call to reschedule.  HOME CARE INSTRUCTIONS    Keep your prenatal appointments and tests as scheduled.   Tell your caregiver if you have any of the above risk factors.   Get plenty of rest and sleep.   Eat a balanced   diet that is low in salt, and do not add salt to your food.   Avoid stressful situations.   Only take over-the-counter and prescriptions medicines for pain, discomfort, or fever as directed by your caregiver.  SEEK IMMEDIATE MEDICAL CARE IF:    You develop severe swelling anywhere in the body. This usually occurs in the legs.   You gain 5 lb/2.3 kg or more in a week.   You develop a severe headache, dizziness, problems with your vision, or confusion.   You have abdominal pain, nausea, or vomiting.   You have a seizure.   You  have trouble moving any part of your body, or you develop numbness or problems speaking.   You have bruising or abnormal bleeding from anywhere in the body.   You develop a stiff neck.   You pass out.  MAKE SURE YOU:    Understand these instructions.   Will watch your condition.   Will get help right away if you are not doing well or get worse.  Document Released: 12/13/2000 Document Revised: 12/05/2011 Document Reviewed: 07/29/2008  Exit[employer] Patient Information 2012 [employer].

## 2012-05-07 NOTE — Progress Notes (Signed)
Addended by: Adam Phenix on: 05/07/2012 09:43 AM   Modules accepted: Orders

## 2012-05-07 NOTE — Progress Notes (Signed)
Pulse: 97

## 2012-05-07 NOTE — Progress Notes (Signed)
Pt had MAU visit yesterday for R/O SROM

## 2012-05-11 ENCOUNTER — Ambulatory Visit (INDEPENDENT_AMBULATORY_CARE_PROVIDER_SITE_OTHER): Payer: Medicaid Other | Admitting: *Deleted

## 2012-05-11 VITALS — BP 126/66 | Wt 207.8 lb

## 2012-05-11 DIAGNOSIS — O10019 Pre-existing essential hypertension complicating pregnancy, unspecified trimester: Secondary | ICD-10-CM

## 2012-05-11 NOTE — Progress Notes (Signed)
NST performed on 05/11/2012 was reviewed and was found to be reactive.  AFI 7.9 cm, continue weekly AFI checks, and other recommended antenatal testing and prenatal care.

## 2012-05-11 NOTE — Progress Notes (Signed)
Addended by: Evart Mcdonnell L on: 05/11/2012 02:46 PM   Modules accepted: Level of Service  

## 2012-05-11 NOTE — Progress Notes (Signed)
P = 87 

## 2012-05-14 ENCOUNTER — Ambulatory Visit (INDEPENDENT_AMBULATORY_CARE_PROVIDER_SITE_OTHER): Payer: Medicaid Other | Admitting: Obstetrics & Gynecology

## 2012-05-14 VITALS — BP 132/82 | Temp 98.2°F | Wt 207.8 lb

## 2012-05-14 DIAGNOSIS — R809 Proteinuria, unspecified: Secondary | ICD-10-CM

## 2012-05-14 DIAGNOSIS — N289 Disorder of kidney and ureter, unspecified: Secondary | ICD-10-CM

## 2012-05-14 DIAGNOSIS — N883 Incompetence of cervix uteri: Secondary | ICD-10-CM

## 2012-05-14 DIAGNOSIS — O3430 Maternal care for cervical incompetence, unspecified trimester: Secondary | ICD-10-CM

## 2012-05-14 DIAGNOSIS — O1210 Gestational proteinuria, unspecified trimester: Secondary | ICD-10-CM

## 2012-05-14 DIAGNOSIS — O26839 Pregnancy related renal disease, unspecified trimester: Secondary | ICD-10-CM

## 2012-05-14 DIAGNOSIS — O10019 Pre-existing essential hypertension complicating pregnancy, unspecified trimester: Secondary | ICD-10-CM

## 2012-05-14 LAB — POCT URINALYSIS DIP (DEVICE)
Hgb urine dipstick: NEGATIVE
Ketones, ur: NEGATIVE mg/dL
Nitrite: NEGATIVE
Protein, ur: 30 mg/dL — AB
pH: 6 (ref 5.0–8.0)

## 2012-05-14 LAB — CBC
HCT: 35.2 % — ABNORMAL LOW (ref 36.0–46.0)
MCH: 29.3 pg (ref 26.0–34.0)
MCHC: 33.5 g/dL (ref 30.0–36.0)
RDW: 13.7 % (ref 11.5–15.5)

## 2012-05-14 LAB — COMPREHENSIVE METABOLIC PANEL
AST: 16 U/L (ref 0–37)
Albumin: 3.4 g/dL — ABNORMAL LOW (ref 3.5–5.2)
Alkaline Phosphatase: 114 U/L (ref 39–117)
BUN: 8 mg/dL (ref 6–23)
Creat: 0.54 mg/dL (ref 0.50–1.10)
Glucose, Bld: 89 mg/dL (ref 70–99)
Potassium: 3.8 mEq/L (ref 3.5–5.3)
Total Bilirubin: 0.3 mg/dL (ref 0.3–1.2)

## 2012-05-14 NOTE — Progress Notes (Signed)
Pulse: 86

## 2012-05-14 NOTE — Patient Instructions (Signed)
Cerclage of the Cervix  Cerclage of the cervix is a surgical procedure for an incompetent cervix. An incompetent cervix is a weak cervix that opens up before labor begins. Cerclage of the cervix sews the cervix closed during pregnancy.   LET YOUR CAREGIVER KNOW ABOUT:    Allergies to foods or medications.   All over-the-counter, prescription, herbal, eye drops and cream medications you are using.   Taking illegal drugs or drinking an excessive amount of alcohol.   Any recent colds or infections.   Past problems with anesthetics or novocaine.   Past surgery.   History of blood clots or abnormal bleeding problems.   Other medical or health problems.  RISKS AND COMPLICATIONS    Infection.   Bleeding.   Rupturing the amniotic sac (membranes).   Going into early labor and delivery.   Problems with the anesthesia.   Infection of the amniotic sac.  BEFORE THE PROCEDURE    Do not take aspirin.   Do not eat or drink anything 8 hours before the procedure.   Do not smoke.   If you are being admitted the same day as the procedure, arrive at the hospital at least 60 minutes before the surgery or as directed. During this time, you will sign the necessary forms and get prepared for the surgery.   A waiting area is available for family and friends.  PROCEDURE    You will be given an IV (intravenous) and medication to relax you.   You will be put to sleep with a general anesthetic.   A stitch will be placed in and around the cervix to tighten it and keep it closed.  AFTER THE PROCEDURE    You will go to a recovery room where you and the baby are monitored.   Once you are awake, stable, and taking fluids well, barring other problems, you will be allowed to return to your room.   You will usually stay in the hospital overnight.   You may get an injection of progesterone to prevent uterine contractions.   Have someone drive you home and stay with you for a day or two.   You may be given medications to take  when you go home.  HOME CARE INSTRUCTIONS    Only take over-the-counter or prescriptions medicines for pain, discomfort or fever as directed by your caregiver.   Avoid physical activities and exercise until your caregiver says it is okay.   Resume your usual diet.   Do not douche.   Do not have sexual intercourse until your caregiver tells you it is OK.   Keep your follow up surgical and prenatal appointments with your caregiver.  SEEK MEDICAL CARE IF:    You have abnormal vaginal discharge.   You develop a rash.   You are having problems with your medications.   You become lightheaded or feel faint.  SEEK IMMEDIATE MEDICAL CARE IF:    You develop vaginal bleeding.   You are leaking fluid or have a gush of fluid from the vagina.   You develop a temperature of 102 F (38.9 C) or higher.   You pass out.   You have uterine contractions.   You feel the baby is not moving as much as usual or cannot feel the baby move.  Document Released: 11/28/2008 Document Revised: 12/05/2011 Document Reviewed: 11/28/2008  [employer] Patient Information 2012 [employer].

## 2012-05-14 NOTE — Progress Notes (Signed)
Addended by: Doreen Salvage on: 05/14/2012 09:27 AM   Modules accepted: Orders

## 2012-05-14 NOTE — Progress Notes (Signed)
Addended by: Doreen Salvage on: 05/14/2012 11:28 AM   Modules accepted: Orders

## 2012-05-14 NOTE — Progress Notes (Signed)
Patient gave written consent for removal of cerclage. Speculum was inserted and Prolene suture was visible anteriorly. She had some difficulty tolerating manipulation of the cervix. The stitch was clipped but some of the knot remained. Was unable to expose the suture to remove the stitch in its entirety. There was some bleeding which resolved quickly. Recommended that she have the cerclage removed under spinal anesthesia. This obese scheduled for 517 at 9 AM. She was instructed to be n.p.o. prior to the procedure. Her questions were answered.

## 2012-05-14 NOTE — Progress Notes (Signed)
Addended by: Doreen Salvage on: 05/14/2012 11:10 AM   Modules accepted: Orders

## 2012-05-14 NOTE — Progress Notes (Signed)
Addended by: Doreen Salvage on: 05/14/2012 09:29 AM   Modules accepted: Orders

## 2012-05-14 NOTE — Progress Notes (Signed)
Pt desires cerclage removal today.  Pt brought 24 hr urine today.

## 2012-05-15 ENCOUNTER — Encounter (HOSPITAL_COMMUNITY): Payer: Self-pay | Admitting: Anesthesiology

## 2012-05-15 ENCOUNTER — Encounter (HOSPITAL_COMMUNITY)
Admission: RE | Disposition: A | Discharge status: Home / Self Care | Payer: Self-pay | Source: Ambulatory Visit | Attending: Obstetrics & Gynecology

## 2012-05-15 ENCOUNTER — Ambulatory Visit (HOSPITAL_COMMUNITY): Payer: Medicaid Other | Admitting: Anesthesiology

## 2012-05-15 ENCOUNTER — Ambulatory Visit (HOSPITAL_COMMUNITY)
Admission: RE | Admit: 2012-05-15 | Discharge: 2012-05-15 | Disposition: A | Discharge status: Home / Self Care | Payer: Medicaid Other | Source: Ambulatory Visit | Attending: Obstetrics & Gynecology | Admitting: Obstetrics & Gynecology

## 2012-05-15 ENCOUNTER — Encounter (HOSPITAL_COMMUNITY): Payer: Self-pay | Admitting: *Deleted

## 2012-05-15 DIAGNOSIS — O3430 Maternal care for cervical incompetence, unspecified trimester: Secondary | ICD-10-CM | POA: Insufficient documentation

## 2012-05-15 HISTORY — PX: CERVICAL CERCLAGE: SHX1329

## 2012-05-15 LAB — CREATININE CLEARANCE, URINE, 24 HOUR
Creatinine Clearance: 166 mL/min — ABNORMAL HIGH (ref 75–115)
Creatinine, 24H Ur: 1292 mg/d (ref 700–1800)
Creatinine, Urine: 101.3 mg/dL

## 2012-05-15 LAB — CBC
MCHC: 33.1 g/dL (ref 30.0–36.0)
Platelets: 268 10*3/uL (ref 150–400)
RDW: 13.5 % (ref 11.5–15.5)
WBC: 5.9 10*3/uL (ref 4.0–10.5)

## 2012-05-15 LAB — GC/CHLAMYDIA PROBE AMP, GENITAL
Chlamydia, DNA Probe: NEGATIVE
GC Probe Amp, Genital: POSITIVE — AB

## 2012-05-15 LAB — PROTEIN, URINE, 24 HOUR: Protein, Urine: 18 mg/dL

## 2012-05-15 SURGERY — CERCLAGE, CERVIX, VAGINAL APPROACH
Anesthesia: Epidural | Site: Uterus | Wound class: Clean Contaminated

## 2012-05-15 MED ORDER — LIDOCAINE HCL (CARDIAC) 20 MG/ML IV SOLN
INTRAVENOUS | Status: AC
  Filled 2012-05-15: qty 5

## 2012-05-15 MED ORDER — LACTATED RINGERS IV SOLN
INTRAVENOUS | Status: DC
Start: 2012-05-15 — End: 2012-05-15
  Administered 2012-05-15 (×2): via INTRAVENOUS

## 2012-05-15 MED ORDER — CEFAZOLIN SODIUM 1-5 GM-% IV SOLN
1.0000 g | INTRAVENOUS | Status: AC
  Administered 2012-05-15: 1 g via INTRAVENOUS

## 2012-05-15 MED ORDER — LACTATED RINGERS IV SOLN
INTRAVENOUS | Status: DC
Start: 2012-05-15 — End: 2012-05-15

## 2012-05-15 MED ORDER — FENTANYL CITRATE 0.05 MG/ML IJ SOLN
INTRAMUSCULAR | Status: AC
  Filled 2012-05-15: qty 5

## 2012-05-15 MED ORDER — PROPOFOL 10 MG/ML IV EMUL
INTRAVENOUS | Status: AC
  Filled 2012-05-15: qty 20

## 2012-05-15 MED ORDER — CEFAZOLIN SODIUM 1-5 GM-% IV SOLN
INTRAVENOUS | Status: AC
  Filled 2012-05-15: qty 50

## 2012-05-15 MED ORDER — FENTANYL CITRATE 0.05 MG/ML IJ SOLN
INTRAMUSCULAR | Status: DC | PRN
  Administered 2012-05-15: 50 ug via INTRAVENOUS

## 2012-05-15 MED ORDER — LIDOCAINE HCL 1 % IJ SOLN
INTRAMUSCULAR | Status: DC | PRN
  Administered 2012-05-15: 20 mL

## 2012-05-15 MED ORDER — LIDOCAINE-EPINEPHRINE (PF) 2 %-1:200000 IJ SOLN
INTRAMUSCULAR | Status: AC
  Filled 2012-05-15: qty 20

## 2012-05-15 MED ORDER — PROPOFOL 10 MG/ML IV EMUL
INTRAVENOUS | Status: DC | PRN
  Administered 2012-05-15 (×12): 20 mg via INTRAVENOUS

## 2012-05-15 MED ORDER — SODIUM BICARBONATE 8.4 % IV SOLN
INTRAVENOUS | Status: AC
  Filled 2012-05-15: qty 50

## 2012-05-15 SURGICAL SUPPLY — 14 items
CANISTER SUCTION 2500CC (MISCELLANEOUS) ×2 IMPLANT
CATH ROBINSON RED A/P 16FR (CATHETERS) IMPLANT
CLOTH BEACON ORANGE TIMEOUT ST (SAFETY) ×2 IMPLANT
COUNTER NEEDLE 1200 MAGNETIC (NEEDLE) ×2 IMPLANT
GLOVE BIO SURGEON STRL SZ 6.5 (GLOVE) ×2 IMPLANT
GLOVE BIOGEL PI IND STRL 7.0 (GLOVE) ×2 IMPLANT
GLOVE BIOGEL PI INDICATOR 7.0 (GLOVE) ×2
GOWN PREVENTION PLUS LG XLONG (DISPOSABLE) ×4 IMPLANT
NS IRRIG 1000ML POUR BTL (IV SOLUTION) ×2 IMPLANT
PACK VAGINAL MINOR WOMEN LF (CUSTOM PROCEDURE TRAY) ×2 IMPLANT
PAD PREP 24X48 CUFFED NSTRL (MISCELLANEOUS) ×2 IMPLANT
TOWEL OR 17X24 6PK STRL BLUE (TOWEL DISPOSABLE) ×4 IMPLANT
TUBING NON-CON 1/4 X 20 CONN (TUBING) ×2 IMPLANT
YANKAUER SUCT BULB TIP NO VENT (SUCTIONS) ×2 IMPLANT

## 2012-05-15 NOTE — Transfer of Care (Signed)
Immediate Anesthesia Transfer of Care Note  Patient: Julie Lyons  Procedure(s) Performed: Procedure(s) (LRB): CERCLAGE CERVICAL (N/A)  Patient Location: PACU  Anesthesia Type: MAC  Level of Consciousness: awake, alert  and oriented  Airway & Oxygen Therapy: Patient Spontanous Breathing  Post-op Assessment: Report given to PACU RN and Post -op Vital signs reviewed and stable  Post vital signs: stable  Complications: No apparent anesthesia complications

## 2012-05-15 NOTE — H&P (Signed)
Julie Lyons is a 31 y.o. female presenting for cerclage removal. Maternal Medical History:  Reason for admission: Remove cerclage  Contractions: Frequency: rare.    Fetal activity: Perceived fetal activity is normal.   Last perceived fetal movement was within the past hour.    Prenatal complications: Incompetent cervix, cerclage. Unable to remove in office.    OB History    Grav Para Term Preterm Abortions TAB SAB Ect Mult Living   6 3 3  2 1 1   3      Past Medical History  Diagnosis Date  . Ovarian cyst   . Hypertension     no medications-pt states b/p has been elevated for past 4 months at office visits  . Asthma     albuterol inhaler-not used in past 30 days  . Headache    Past Surgical History  Procedure Date  . Oophorectomy     left ovary and tube  . Tonsillectomy   . Vaginal delivery 1997, 2000. 2004  . Cervical cerclage 12/02/2011    Procedure: CERCLAGE CERVICAL;  Surgeon: Tereso Newcomer, MD;  Location: [facility];  Service: Gynecology;  Laterality: N/A;   Family History: family history includes Anesthesia problems in her mother and Hypertension in her father. Social History:  reports that she quit smoking about 7 months ago. Her smoking use included Cigarettes. She has a 40 pack-year smoking history. She has never used smokeless tobacco. She reports that she does not drink alcohol or use illicit drugs.  Review of Systems  Constitutional: Negative for fever.  Genitourinary:       Spotting after after attempt to remove in office  Neurological: Negative for headaches.      Blood pressure 124/87, pulse 76, temperature 98.4 F (36.9 C), temperature source Oral, resp. rate 18, last menstrual period 09/03/2011, SpO2 97.00%. Maternal Exam:  Uterine Assessment: Contraction frequency is rare.   Abdomen: Patient reports no abdominal tenderness. Fundal height is 36cm.   Estimated fetal weight is 6 lb.   Fetal presentation: vertex  Introitus: Normal vulva. Normal  vagina.  Pelvis: adequate for delivery.   Cervix: Cervix evaluated by digital exam.     Physical Exam  Constitutional: She is oriented to person, place, and time. She appears well-developed. No distress.  Cardiovascular: Normal rate and regular rhythm.   Respiratory: Effort normal and breath sounds normal.  GI:       Gravid not tender  Neurological: She is alert and oriented to person, place, and time.  Skin: Skin is warm and dry.  Psychiatric: She has a normal mood and affect. Her behavior is normal.    Prenatal labs: ABO, Rh: O/POS/-- (11/29 1017) Antibody: NEG (11/29 1017) Rubella: 52.3 (11/29 1017) RPR: NON REAC (03/07 0955)  HBsAg: NEGATIVE (11/29 1017)  HIV: NON REACTIVE (03/07 0955)  GBS:     Assessment/Plan: Cerclage in place at 36.3 weeks to be removed. Risk of anesthesia, bleeding, ROM, labor discussed and questions were answered.     Itay Mella 05/15/2012, 8:08 AM

## 2012-05-15 NOTE — Op Note (Signed)
Procedure: Removal of cervical cerclage Preoperative diagnosis: Intrauterine pregnancy 36 weeks 3 days gestation with cerclage in place for cervical incompetence Postoperative diagnosis: Same Surgeon: Dr. Scheryl Darter Anesthesia MAC Complications: None Specimen: None Blood loss less than 25 L Drains : none  Patient gave consent to have her cervical cerclage removed in the OR under anesthesia. Patient is 36 weeks 3 days gestation with history of cervical incompetence. Patient was unable to tolerate removal of cerclage in clinic on 05/14/2012. She was brought to the OR and MAC was induced. His placed in dorsal lithotomy position. Perineum was prepped and patient was sterilely draped. After adequate anesthesia was induced cervix was visualized using speculum. The suture knot was visible at 12:00. This was grasped with Allis clamp and traction was applied. Mayo scissors were used to clip the suture and it was removed intact. There was some bleeding from the site of of removal and this was observed until bleeding was minimal. Patient tolerated the procedure well without complications. She was brought in stable condition to the PACU.  Dr. Scheryl Darter May 15 2012 9:52 AM

## 2012-05-15 NOTE — Anesthesia Preprocedure Evaluation (Addendum)
Anesthesia Evaluation  Patient identified by MRN, date of birth, ID band Patient awake    Reviewed: Allergy & Precautions, H&P , Patient's Chart, lab work & pertinent test results  Airway Mallampati: II TM Distance: >3 FB Neck ROM: full    Dental No notable dental hx.    Pulmonary neg pulmonary ROS, asthma ,  breath sounds clear to auscultation  Pulmonary exam normal       Cardiovascular hypertension, negative cardio ROS  Rhythm:regular Rate:Normal     Neuro/Psych  Headaches, negative neurological ROS  negative psych ROS   GI/Hepatic negative GI ROS, Neg liver ROS,   Endo/Other  negative endocrine ROS  Renal/GU negative Renal ROS     Musculoskeletal   Abdominal   Peds  Hematology negative hematology ROS (+)   Anesthesia Other Findings   Reproductive/Obstetrics (+) Pregnancy                           Anesthesia Physical Anesthesia Plan  ASA: II  Anesthesia Plan: MAC   Post-op Pain Management:    Induction:   Airway Management Planned:   Additional Equipment:   Intra-op Plan:   Post-operative Plan:   Informed Consent: I have reviewed the patients History and Physical, chart, labs and discussed the procedure including the risks, benefits and alternatives for the proposed anesthesia with the patient or authorized representative who has indicated his/her understanding and acceptance.     Plan Discussed with:   Anesthesia Plan Comments:        Anesthesia Quick Evaluation

## 2012-05-17 LAB — CULTURE, BETA STREP (GROUP B ONLY)

## 2012-05-18 ENCOUNTER — Other Ambulatory Visit: Payer: Self-pay | Admitting: Obstetrics & Gynecology

## 2012-05-18 ENCOUNTER — Ambulatory Visit (INDEPENDENT_AMBULATORY_CARE_PROVIDER_SITE_OTHER): Payer: Medicaid Other

## 2012-05-18 ENCOUNTER — Encounter (HOSPITAL_COMMUNITY): Payer: Self-pay | Admitting: Obstetrics & Gynecology

## 2012-05-18 VITALS — BP 138/76 | Wt 211.4 lb

## 2012-05-18 DIAGNOSIS — O3430 Maternal care for cervical incompetence, unspecified trimester: Secondary | ICD-10-CM

## 2012-05-18 DIAGNOSIS — O10019 Pre-existing essential hypertension complicating pregnancy, unspecified trimester: Secondary | ICD-10-CM

## 2012-05-18 DIAGNOSIS — O09299 Supervision of pregnancy with other poor reproductive or obstetric history, unspecified trimester: Secondary | ICD-10-CM

## 2012-05-18 DIAGNOSIS — O139 Gestational [pregnancy-induced] hypertension without significant proteinuria, unspecified trimester: Secondary | ICD-10-CM

## 2012-05-18 NOTE — Anesthesia Postprocedure Evaluation (Signed)
Anesthesia Post Note  Patient: Julie Lyons  Procedure(s) Performed: Procedure(s) (LRB): CERCLAGE CERVICAL (N/A)  Anesthesia type: MAC  Patient location: PACU  Post pain: Pain level controlled  Post assessment: Post-op Vital signs reviewed  Last Vitals:  Filed Vitals:   05/15/12 1030  BP: 124/80  Pulse: 75  Temp: 36.8 C  Resp: 18    Post vital signs: Reviewed  Level of consciousness: sedated  Complications: No apparent anesthesia complications

## 2012-05-18 NOTE — Progress Notes (Signed)
NST performed on 05/18/2012 was reviewed and was found to be reactive.  AFI 11.5 cm.  Continue recommended antenatal testing and prenatal care.

## 2012-05-18 NOTE — Progress Notes (Signed)
P = 85     [place] growth scheduled on 05/26/12

## 2012-05-19 ENCOUNTER — Telehealth (HOSPITAL_COMMUNITY): Payer: Self-pay | Admitting: *Deleted

## 2012-05-19 NOTE — Telephone Encounter (Signed)
Preadmission screen  

## 2012-05-20 ENCOUNTER — Telehealth: Payer: Self-pay

## 2012-05-20 NOTE — Telephone Encounter (Signed)
Pt call and stated that she had a question about leakage?  Can someone please call back.

## 2012-05-20 NOTE — Telephone Encounter (Signed)
Patient called and left another message stating she is calling about leaking problems

## 2012-05-21 ENCOUNTER — Ambulatory Visit (HOSPITAL_COMMUNITY)
Admission: RE | Admit: 2012-05-21 | Discharge: 2012-05-21 | Disposition: A | Discharge status: Home / Self Care | Payer: Medicaid Other | Source: Ambulatory Visit | Attending: Obstetrics & Gynecology | Admitting: Obstetrics & Gynecology

## 2012-05-21 ENCOUNTER — Ambulatory Visit (INDEPENDENT_AMBULATORY_CARE_PROVIDER_SITE_OTHER): Payer: Medicaid Other | Admitting: Obstetrics & Gynecology

## 2012-05-21 ENCOUNTER — Encounter (HOSPITAL_COMMUNITY): Payer: Self-pay | Admitting: *Deleted

## 2012-05-21 ENCOUNTER — Inpatient Hospital Stay (HOSPITAL_COMMUNITY)
Admission: AD | Admit: 2012-05-21 | Discharge: 2012-05-24 | DRG: 774 | Disposition: A | Discharge status: Home / Self Care | Payer: Medicaid Other | Source: Ambulatory Visit | Attending: Obstetrics & Gynecology | Admitting: Obstetrics & Gynecology

## 2012-05-21 ENCOUNTER — Encounter (HOSPITAL_COMMUNITY): Payer: Self-pay | Admitting: Anesthesiology

## 2012-05-21 ENCOUNTER — Inpatient Hospital Stay (HOSPITAL_COMMUNITY): Payer: Medicaid Other | Admitting: Anesthesiology

## 2012-05-21 VITALS — BP 130/83 | Temp 98.6°F | Wt 209.2 lb

## 2012-05-21 DIAGNOSIS — O3430 Maternal care for cervical incompetence, unspecified trimester: Secondary | ICD-10-CM | POA: Diagnosis present

## 2012-05-21 DIAGNOSIS — O2340 Unspecified infection of urinary tract in pregnancy, unspecified trimester: Secondary | ICD-10-CM

## 2012-05-21 DIAGNOSIS — O9822 Gonorrhea complicating childbirth: Secondary | ICD-10-CM | POA: Diagnosis present

## 2012-05-21 DIAGNOSIS — O139 Gestational [pregnancy-induced] hypertension without significant proteinuria, unspecified trimester: Secondary | ICD-10-CM | POA: Insufficient documentation

## 2012-05-21 DIAGNOSIS — O1002 Pre-existing essential hypertension complicating childbirth: Secondary | ICD-10-CM | POA: Diagnosis present

## 2012-05-21 DIAGNOSIS — O4290 Premature rupture of membranes, unspecified as to length of time between rupture and onset of labor, unspecified weeks of gestation: Secondary | ICD-10-CM | POA: Diagnosis present

## 2012-05-21 DIAGNOSIS — O09299 Supervision of pregnancy with other poor reproductive or obstetric history, unspecified trimester: Secondary | ICD-10-CM | POA: Insufficient documentation

## 2012-05-21 DIAGNOSIS — O1210 Gestational proteinuria, unspecified trimester: Secondary | ICD-10-CM

## 2012-05-21 DIAGNOSIS — O10019 Pre-existing essential hypertension complicating pregnancy, unspecified trimester: Secondary | ICD-10-CM

## 2012-05-21 DIAGNOSIS — N883 Incompetence of cervix uteri: Secondary | ICD-10-CM

## 2012-05-21 DIAGNOSIS — IMO0002 Reserved for concepts with insufficient information to code with codable children: Principal | ICD-10-CM | POA: Diagnosis present

## 2012-05-21 DIAGNOSIS — A5400 Gonococcal infection of lower genitourinary tract, unspecified: Secondary | ICD-10-CM | POA: Diagnosis present

## 2012-05-21 LAB — CBC
Hemoglobin: 12 g/dL (ref 12.0–15.0)
MCH: 30.2 pg (ref 26.0–34.0)
MCHC: 34.2 g/dL (ref 30.0–36.0)
Platelets: 271 10*3/uL (ref 150–400)

## 2012-05-21 LAB — POCT URINALYSIS DIP (DEVICE)
Bilirubin Urine: NEGATIVE
Glucose, UA: NEGATIVE mg/dL
Leukocytes, UA: NEGATIVE
Nitrite: NEGATIVE
Urobilinogen, UA: 0.2 mg/dL (ref 0.0–1.0)

## 2012-05-21 MED ORDER — OXYTOCIN 20 UNITS IN LACTATED RINGERS INFUSION - SIMPLE
1.0000 m[IU]/min | INTRAVENOUS | Status: DC
  Administered 2012-05-21: 2 m[IU]/min via INTRAVENOUS
  Filled 2012-05-21: qty 1000

## 2012-05-21 MED ORDER — OXYCODONE-ACETAMINOPHEN 5-325 MG PO TABS: 1.0000 | ORAL_TABLET | ORAL | Status: DC | PRN

## 2012-05-21 MED ORDER — SODIUM BICARBONATE 8.4 % IV SOLN
INTRAVENOUS | Status: DC | PRN
  Administered 2012-05-21: 4 mL via EPIDURAL

## 2012-05-21 MED ORDER — CEFTRIAXONE SODIUM 250 MG IJ SOLR
250.0000 mg | Freq: Once | INTRAMUSCULAR | Status: AC
  Administered 2012-05-21: 250 mg via INTRAMUSCULAR
  Filled 2012-05-21: qty 250

## 2012-05-21 MED ORDER — LIDOCAINE-PRILOCAINE 2.5-2.5 % EX CREA
TOPICAL_CREAM | Freq: Once | CUTANEOUS | Status: DC
  Filled 2012-05-21: qty 5

## 2012-05-21 MED ORDER — FENTANYL 2.5 MCG/ML BUPIVACAINE 1/10 % EPIDURAL INFUSION (WH - ANES)
14.0000 mL/h | INTRAMUSCULAR | Status: DC
  Administered 2012-05-22: 14 mL/h via EPIDURAL
  Filled 2012-05-21 (×2): qty 60

## 2012-05-21 MED ORDER — PHENYLEPHRINE 40 MCG/ML (10ML) SYRINGE FOR IV PUSH (FOR BLOOD PRESSURE SUPPORT): 80.0000 ug | PREFILLED_SYRINGE | INTRAVENOUS | Status: DC | PRN

## 2012-05-21 MED ORDER — PHENYLEPHRINE 40 MCG/ML (10ML) SYRINGE FOR IV PUSH (FOR BLOOD PRESSURE SUPPORT)
80.0000 ug | PREFILLED_SYRINGE | INTRAVENOUS | Status: DC | PRN
  Filled 2012-05-21: qty 5

## 2012-05-21 MED ORDER — FLEET ENEMA 7-19 GM/118ML RE ENEM: 1.0000 | ENEMA | RECTAL | Status: DC | PRN

## 2012-05-21 MED ORDER — LIDOCAINE HCL (PF) 1 % IJ SOLN
30.0000 mL | INTRAMUSCULAR | Status: DC | PRN
  Filled 2012-05-21: qty 30

## 2012-05-21 MED ORDER — LACTATED RINGERS IV SOLN
500.0000 mL | Freq: Once | INTRAVENOUS | Status: AC
  Administered 2012-05-21: 500 mL via INTRAVENOUS

## 2012-05-21 MED ORDER — OXYTOCIN BOLUS FROM INFUSION
500.0000 mL | Freq: Once | INTRAVENOUS | Status: DC
  Administered 2012-05-22: 500 mL via INTRAVENOUS
  Filled 2012-05-21: qty 500

## 2012-05-21 MED ORDER — DIPHENHYDRAMINE HCL 50 MG/ML IJ SOLN: 12.5000 mg | INTRAMUSCULAR | Status: DC | PRN

## 2012-05-21 MED ORDER — IBUPROFEN 600 MG PO TABS: 600.0000 mg | ORAL_TABLET | Freq: Four times a day (QID) | ORAL | Status: DC | PRN

## 2012-05-21 MED ORDER — EPHEDRINE 5 MG/ML INJ: 10.0000 mg | INTRAVENOUS | Status: DC | PRN

## 2012-05-21 MED ORDER — TERBUTALINE SULFATE 1 MG/ML IJ SOLN: 0.2500 mg | Freq: Once | INTRAMUSCULAR | Status: AC | PRN

## 2012-05-21 MED ORDER — LACTATED RINGERS IV SOLN
INTRAVENOUS | Status: DC
  Administered 2012-05-21 (×2): via INTRAVENOUS

## 2012-05-21 MED ORDER — LACTATED RINGERS IV SOLN: 500.0000 mL | INTRAVENOUS | Status: DC | PRN

## 2012-05-21 MED ORDER — ACETAMINOPHEN 325 MG PO TABS: 650.0000 mg | ORAL_TABLET | ORAL | Status: DC | PRN

## 2012-05-21 MED ORDER — NALBUPHINE SYRINGE 5 MG/0.5 ML: 5.0000 mg | INJECTION | INTRAMUSCULAR | Status: DC | PRN

## 2012-05-21 MED ORDER — OXYTOCIN 20 UNITS IN LACTATED RINGERS INFUSION - SIMPLE: 125.0000 mL/h | Freq: Once | INTRAVENOUS | Status: DC

## 2012-05-21 MED ORDER — ONDANSETRON HCL 4 MG/2ML IJ SOLN: 4.0000 mg | Freq: Four times a day (QID) | INTRAMUSCULAR | Status: DC | PRN

## 2012-05-21 MED ORDER — CITRIC ACID-SODIUM CITRATE 334-500 MG/5ML PO SOLN: 30.0000 mL | ORAL | Status: DC | PRN

## 2012-05-21 MED ORDER — FENTANYL 2.5 MCG/ML BUPIVACAINE 1/10 % EPIDURAL INFUSION (WH - ANES)
INTRAMUSCULAR | Status: DC | PRN
  Administered 2012-05-21: 14 mL/h via EPIDURAL

## 2012-05-21 MED ORDER — EPHEDRINE 5 MG/ML INJ
10.0000 mg | INTRAVENOUS | Status: DC | PRN
  Filled 2012-05-21: qty 4

## 2012-05-21 NOTE — Anesthesia Preprocedure Evaluation (Signed)
Anesthesia Evaluation  Patient identified by MRN, date of birth, ID band Patient awake    Reviewed: Allergy & Precautions, H&P , Patient's Chart, lab work & pertinent test results  Airway Mallampati: II TM Distance: >3 FB Neck ROM: full    Dental  (+) Teeth Intact   Pulmonary asthma (rare inhaler use) ,  breath sounds clear to auscultation        Cardiovascular Rhythm:regular Rate:Normal     Neuro/Psych    GI/Hepatic   Endo/Other    Renal/GU      Musculoskeletal   Abdominal   Peds  Hematology   Anesthesia Other Findings       Reproductive/Obstetrics (+) Pregnancy                          Anesthesia Physical Anesthesia Plan  ASA: II  Anesthesia Plan: Epidural   Post-op Pain Management:    Induction:   Airway Management Planned:   Additional Equipment:   Intra-op Plan:   Post-operative Plan:   Informed Consent: I have reviewed the patients History and Physical, chart, labs and discussed the procedure including the risks, benefits and alternatives for the proposed anesthesia with the patient or authorized representative who has indicated his/her understanding and acceptance.   Dental Advisory Given  Plan Discussed with:   Anesthesia Plan Comments: (Labs checked- platelets confirmed with RN in room. Fetal heart tracing, per RN, reported to be stable enough for sitting procedure. Discussed epidural, and patient consents to the procedure:  included risk of possible headache,backache, failed block, allergic reaction, and nerve injury. This patient was asked if she had any questions or concerns before the procedure started.)        Anesthesia Quick Evaluation  

## 2012-05-21 NOTE — Patient Instructions (Signed)
Normal Labor and Delivery Your caregiver must first be sure you are in labor. Signs of labor include:  You may pass what is called "the mucus plug" before labor begins. This is a small amount of blood stained mucus.   Regular uterine contractions.   The time between contractions get closer together.   The discomfort and pain gradually gets more intense.   Pains are mostly located in the back.   Pains get worse when walking.   The cervix (the opening of the uterus becomes thinner (begins to efface) and opens up (dilates).  Once you are in labor and admitted into the hospital or care center, your caregiver will do the following:  A complete physical examination.   Check your vital signs (blood pressure, pulse, temperature and the fetal heart rate).   Do a vaginal examination (using a sterile glove and lubricant) to determine:   The position (presentation) of the baby (head [vertex] or buttock first).   The level (station) of the baby's head in the birth canal.   The effacement and dilatation of the cervix.   You may have your pubic hair shaved and be given an enema depending on your caregiver and the circumstance.   An electronic monitor is usually placed on your abdomen. The monitor follows the length and intensity of the contractions, as well as the baby's heart rate.   Usually, your caregiver will insert an IV in your arm with a bottle of sugar water. This is done as a precaution so that medications can be given to you quickly during labor or delivery.  NORMAL LABOR AND DELIVERY IS DIVIDED UP INTO 3 STAGES: First Stage This is when regular contractions begin and the cervix begins to efface and dilate. This stage can last from 3 to 15 hours. The end of the first stage is when the cervix is 100% effaced and 10 centimeters dilated. Pain medications may be given by   Injection (morphine, demerol, etc.)   Regional anesthesia (spinal, caudal or epidural, anesthetics given in  different locations of the spine). Paracervical pain medication may be given, which is an injection of and anesthetic on each side of the cervix.  A pregnant woman may request to have "Natural Childbirth" which is not to have any medications or anesthesia during her labor and delivery. Second Stage This is when the baby comes down through the birth canal (vagina) and is born. This can take 1 to 4 hours. As the baby's head comes down through the birth canal, you may feel like you are going to have a bowel movement. You will get the urge to bear down and push until the baby is delivered. As the baby's head is being delivered, the caregiver will decide if an episiotomy (a cut in the perineum and vagina area) is needed to prevent tearing of the tissue in this area. The episiotomy is sewn up after the delivery of the baby and placenta. Sometimes a mask with nitrous oxide is given for the mother to breath during the delivery of the baby to help if there is too much pain. The end of Stage 2 is when the baby is fully delivered. Then when the umbilical cord stops pulsating it is clamped and cut. Third Stage The third stage begins after the baby is completely delivered and ends after the placenta (afterbirth) is delivered. This usually takes 5 to 30 minutes. After the placenta is delivered, a medication is given either by intravenous or injection to help contract   the uterus and prevent bleeding. The third stage is not painful and pain medication is usually not necessary. If an episiotomy was done, it is repaired at this time. After the delivery, the mother is watched and monitored closely for 1 to 2 hours to make sure there is no postpartum bleeding (hemorrhage). If there is a lot of bleeding, medication is given to contract the uterus and stop the bleeding. Document Released: 09/24/2008 Document Revised: 12/05/2011 Document Reviewed: 09/24/2008 [employer] Patient Information 2012 [employer]. 

## 2012-05-21 NOTE — Progress Notes (Signed)
Pt has Ob US growth and NST @ [facility] on 05/26/12

## 2012-05-21 NOTE — Progress Notes (Signed)
Patient sent to U/S to be worked in at 1015 am for a limited U/S.

## 2012-05-21 NOTE — Progress Notes (Signed)
Patient ID: Julie Lyons, female   DOB: Sep 21, 1981, 31 y.o.   MRN: [id number]   Baby vertex reconfirmed by [facility].  Levie Heritage, DO 05/21/2012 5:31 PM

## 2012-05-21 NOTE — H&P (Signed)
Julie Lyons is a 31 y.o. female presenting for PROM. History This is a 31 year old G6 P3 0-3 at 37 weeks and 2 days who was sent to the MAU from clinic with question of ruptured membranes. The patient had rupture membranes 2 days ago, but did not have this evaluated. Patient was seen in the clinic this morning and had a negative nitrazine, negative pooling, and negative fern test. She was sent to ultrasound for AFI which was low normal. She was subsequently sent to the MAU for an Amnisure test. She denies contractions, decreased fetal activity, vaginal bleeding. She does admit to leaking fluid and has been wearing Depends.  OB History    Grav Para Term Preterm Abortions TAB SAB Ect Mult Living   6 3 3  2 1 1   3      Past Medical History  Diagnosis Date  . Ovarian cyst   . Hypertension     no medications-pt states b/p has been elevated for past 4 months at office visits  . Asthma     albuterol inhaler-not used in past 30 days  . Headache    Past Surgical History  Procedure Date  . Oophorectomy     left ovary and tube  . Tonsillectomy   . Vaginal delivery 1997, 2000. 2004  . Cervical cerclage 12/02/2011    Procedure: CERCLAGE CERVICAL;  Surgeon: Tereso Newcomer, MD;  Location: [facility];  Service: Gynecology;  Laterality: N/A;  . Cervical cerclage 05/15/2012    Procedure: CERCLAGE CERVICAL;  Surgeon: Adam Phenix, MD;  Location: [facility];  Service: Gynecology;  Laterality: N/A;  removal of cerclage from cervix   Family History: family history includes Anesthesia problems in her mother and Hypertension in her father. Social History:  reports that she quit smoking about 7 months ago. Her smoking use included Cigarettes. She has a 40 pack-year smoking history. She has never used smokeless tobacco. She reports that she does not drink alcohol or use illicit drugs.  Review of Systems  All other systems reviewed and are negative.    Dilation: 4 Effacement (%): 50 Exam by:: Dr.  Adrian Blackwater Blood pressure 136/90, pulse 81, temperature 98.2 F (36.8 C), temperature source Oral, resp. rate 20, last menstrual period 09/03/2011. Maternal Exam:  Abdomen: Fundal height is Term.   Estimated fetal weight is 7 pounds.   Fetal presentation: vertex  Pelvis: adequate for delivery.   Cervix: Cervix evaluated by digital exam.     Physical Exam  Constitutional: She is oriented to person, place, and time. She appears well-developed and well-nourished.  GI: Soft. Bowel sounds are normal.  Genitourinary:       No genital lesions noted.  Musculoskeletal: Normal range of motion.  Neurological: She is alert and oriented to person, place, and time.  Skin: Skin is warm and dry.  Psychiatric: She has a normal mood and affect. Her behavior is normal. Judgment and thought content normal.    Prenatal labs: ABO, Rh: O/POS/-- (11/29 1017) Antibody: NEG (11/29 1017) Rubella: 52.3 (11/29 1017) RPR: NON REAC (03/07 0955)  HBsAg: NEGATIVE (11/29 1017)  HIV: NON REACTIVE (03/07 0955)  GBS: Negative (05/16 0000)  Gonorrhea swab on 05/18/2012 was positive   Assessment/Plan: #1 intrauterine pregnancy at 37 weeks and 2 days #2 cervical incompetence #3 history of genital herpes #4 premature rupture membranes  We'll admit the patient to labor and delivery and start Pitocin. We will treat the patient with ceftriaxone for positive gonorrhea. Discussed with patient that  sexual partner should be treated.  Breast and bottle feeding.  [facility] for pediatric provider.  Ashly Yepez JEHIEL 05/21/2012, 4:50 PM

## 2012-05-21 NOTE — Anesthesia Procedure Notes (Signed)

## 2012-05-21 NOTE — Telephone Encounter (Signed)
Patient was seen in office today.  

## 2012-05-21 NOTE — MAU Note (Signed)
Pt was seen by Dr. Debroah Loop this a.m. ? SROM for 2 days, pt states she is wearing depends, clear fluid, no bleeding.

## 2012-05-21 NOTE — Progress Notes (Signed)
P=87 , c/o edema in feet/ankles sometimes,

## 2012-05-21 NOTE — Progress Notes (Signed)
Leaking 2 days, wearing Depend. Scant clear fluid in vagina, pos nitrazine, negative fern, Occasional contraction. NST reactive. Limited US for AFI

## 2012-05-22 ENCOUNTER — Encounter (HOSPITAL_COMMUNITY): Payer: Self-pay

## 2012-05-22 ENCOUNTER — Encounter (HOSPITAL_COMMUNITY): Payer: Self-pay | Admitting: Obstetrics

## 2012-05-22 DIAGNOSIS — O3430 Maternal care for cervical incompetence, unspecified trimester: Secondary | ICD-10-CM

## 2012-05-22 DIAGNOSIS — O4290 Premature rupture of membranes, unspecified as to length of time between rupture and onset of labor, unspecified weeks of gestation: Secondary | ICD-10-CM

## 2012-05-22 DIAGNOSIS — A5400 Gonococcal infection of lower genitourinary tract, unspecified: Secondary | ICD-10-CM

## 2012-05-22 DIAGNOSIS — IMO0002 Reserved for concepts with insufficient information to code with codable children: Secondary | ICD-10-CM

## 2012-05-22 LAB — RPR: RPR Ser Ql: NONREACTIVE

## 2012-05-22 MED ORDER — ONDANSETRON HCL 4 MG PO TABS: 4.0000 mg | ORAL_TABLET | ORAL | Status: DC | PRN

## 2012-05-22 MED ORDER — ONDANSETRON HCL 4 MG/2ML IJ SOLN: 4.0000 mg | INTRAMUSCULAR | Status: DC | PRN

## 2012-05-22 MED ORDER — BENZOCAINE-MENTHOL 20-0.5 % EX AERO: 1.0000 "application " | INHALATION_SPRAY | CUTANEOUS | Status: DC | PRN

## 2012-05-22 MED ORDER — SENNOSIDES-DOCUSATE SODIUM 8.6-50 MG PO TABS
2.0000 | ORAL_TABLET | Freq: Every day | ORAL | Status: DC
  Administered 2012-05-23 (×2): 2 via ORAL

## 2012-05-22 MED ORDER — IBUPROFEN 600 MG PO TABS
600.0000 mg | ORAL_TABLET | Freq: Four times a day (QID) | ORAL | Status: DC
  Administered 2012-05-22 – 2012-05-24 (×9): 600 mg via ORAL
  Filled 2012-05-22 (×9): qty 1

## 2012-05-22 MED ORDER — SIMETHICONE 80 MG PO CHEW
80.0000 mg | CHEWABLE_TABLET | ORAL | Status: DC | PRN
  Administered 2012-05-24: 80 mg via ORAL

## 2012-05-22 MED ORDER — PRENATAL MULTIVITAMIN CH
1.0000 | ORAL_TABLET | Freq: Every day | ORAL | Status: DC
  Administered 2012-05-22 – 2012-05-24 (×3): 1 via ORAL
  Filled 2012-05-22 (×3): qty 1

## 2012-05-22 MED ORDER — OXYCODONE-ACETAMINOPHEN 5-325 MG PO TABS
1.0000 | ORAL_TABLET | ORAL | Status: DC | PRN
  Administered 2012-05-22 – 2012-05-24 (×11): 1 via ORAL
  Filled 2012-05-22 (×11): qty 1

## 2012-05-22 MED ORDER — WITCH HAZEL-GLYCERIN EX PADS: 1.0000 "application " | MEDICATED_PAD | CUTANEOUS | Status: DC | PRN

## 2012-05-22 MED ORDER — DIPHENHYDRAMINE HCL 25 MG PO CAPS: 25.0000 mg | ORAL_CAPSULE | Freq: Four times a day (QID) | ORAL | Status: DC | PRN

## 2012-05-22 MED ORDER — TETANUS-DIPHTH-ACELL PERTUSSIS 5-2.5-18.5 LF-MCG/0.5 IM SUSP: 0.5000 mL | Freq: Once | INTRAMUSCULAR | Status: DC

## 2012-05-22 MED ORDER — LANOLIN HYDROUS EX OINT: TOPICAL_OINTMENT | CUTANEOUS | Status: DC | PRN

## 2012-05-22 MED ORDER — DIBUCAINE 1 % RE OINT: 1.0000 "application " | TOPICAL_OINTMENT | RECTAL | Status: DC | PRN

## 2012-05-22 MED ORDER — ZOLPIDEM TARTRATE 5 MG PO TABS: 5.0000 mg | ORAL_TABLET | Freq: Every evening | ORAL | Status: DC | PRN

## 2012-05-22 NOTE — Progress Notes (Signed)
UR Chart review completed.  

## 2012-05-22 NOTE — Anesthesia Postprocedure Evaluation (Signed)
  Anesthesia Post-op Note  Patient: Julie Lyons  Procedure(s) Performed: * No surgery found *  Patient Location: Mother/Baby  Anesthesia Type: Epidural  Level of Consciousness: awake, alert  and oriented  Airway and Oxygen Therapy: Patient Spontanous Breathing  Post-op Pain: none  Post-op Assessment: Patient's Cardiovascular Status Stable, Respiratory Function Stable, Patent Airway, No signs of Nausea or vomiting, Adequate PO intake, Pain level controlled, No headache, No backache, No residual numbness and No residual motor weakness  Post-op Vital Signs: Reviewed and stable  Complications: No apparent anesthesia complications

## 2012-05-23 NOTE — Progress Notes (Signed)
Post Partum Day 1  Subjective: no complaints, up ad lib, voiding, tolerating PO and + flatus  Objective: Blood pressure 120/77, pulse 61, temperature 97.7 F (36.5 C), temperature source Oral, resp. rate 18, height 5\' 7"  (1.702 m), weight 94.802 kg (209 lb), last menstrual period 09/03/2011, SpO2 98.00%, unknown if currently breastfeeding.  Physical Exam:  General: alert, cooperative and no distress Lochia: appropriate Uterine Fundus: firm Incision: NA DVT Evaluation: Negative Homan's sign.   Basename 05/21/12 1715  HGB 12.0  HCT 35.1*    Assessment/Plan: Considering discharge today pending baby's disharge. Bottle feeding Interval BTL for BC.    LOS: 2 days   Julie Lyons 05/23/2012, 7:22 AM

## 2012-05-24 MED ORDER — IBUPROFEN 600 MG PO TABS: 600.0000 mg | ORAL_TABLET | Freq: Four times a day (QID) | ORAL | Status: AC

## 2012-05-24 MED ORDER — HYDROCHLOROTHIAZIDE 25 MG PO TABS: 25.0000 mg | ORAL_TABLET | Freq: Every day | ORAL | Status: DC

## 2012-05-24 MED ORDER — ACETAMINOPHEN-CODEINE 300-30 MG PO TABS: 1.0000 | ORAL_TABLET | ORAL | Status: AC | PRN

## 2012-05-24 NOTE — Discharge Summary (Signed)
Obstetric Discharge Summary Reason for Admission: rupture of membranes Prenatal Procedures: cerclage, NST, Preeclampsia and ultrasound Intrapartum Procedures: spontaneous vaginal delivery and Rocephin for gonorrhea Postpartum Procedures: none Complications-Operative and Postpartum: none Hemoglobin  Date Value Range Status  05/21/2012 12.0  12.0-15.0 (g/dL) Final     HCT  Date Value Range Status  05/21/2012 35.1* 36.0-46.0 (%) Final    Physical Exam:  General: alert, cooperative and appears stated age Lochia: appropriate Uterine Fundus: firm Incision: n/a DVT Evaluation: No evidence of DVT seen on physical exam. Negative Homan's sign.  Discharge Diagnoses: Term Pregnancy-delivered and Preelampsia  Discharge Information: Date: 05/24/2012 Activity: pelvic rest Diet: routine Medications: Tylenol #3, Ibuprofen and HCTZ Condition: stable Instructions: refer to practice specific booklet Discharge to: home Follow-up Information    Follow up with [facility] in 2 weeks. (BP check)          Newborn Data: Live born female  Birth Weight: 6 lb 7.5 oz (2935 g) APGAR: 9, 10  Home with mother.  [facility] 05/24/2012, 7:45 AM

## 2012-05-24 NOTE — Progress Notes (Signed)
Patient has bed elevated and infant sleeping on pillow. Bed lowered and patient given information regarding infant safety, such as not using pillows for infant to lay on and to keep infant in crib when sleeping to prevent falls or injury. Mother elevated bed before this staff member could leave room and continues to sleep with infant on pillow.

## 2012-05-24 NOTE — Progress Notes (Signed)
Found FOB asleep in patients bed. Patient lying on couch on room. Patients bed remains elevated and infant asleep on pillow. FOB awakened and infant placed back in bassinet.

## 2012-05-26 ENCOUNTER — Ambulatory Visit (HOSPITAL_COMMUNITY): Admission: RE | Admit: 2012-05-26 | Payer: Medicaid Other | Source: Ambulatory Visit

## 2012-05-26 ENCOUNTER — Ambulatory Visit (HOSPITAL_COMMUNITY): Payer: Medicaid Other

## 2012-05-27 ENCOUNTER — Other Ambulatory Visit: Payer: Self-pay | Admitting: Obstetrics & Gynecology

## 2012-05-28 ENCOUNTER — Other Ambulatory Visit: Payer: Medicaid Other

## 2012-06-01 ENCOUNTER — Other Ambulatory Visit: Payer: Medicaid Other

## 2012-06-10 ENCOUNTER — Encounter (HOSPITAL_COMMUNITY): Payer: Self-pay

## 2012-07-09 ENCOUNTER — Encounter: Payer: Self-pay | Admitting: Obstetrics and Gynecology

## 2012-07-09 ENCOUNTER — Ambulatory Visit: Payer: Medicaid Other | Admitting: Obstetrics and Gynecology

## 2012-07-27 ENCOUNTER — Ambulatory Visit (INDEPENDENT_AMBULATORY_CARE_PROVIDER_SITE_OTHER): Payer: Medicaid Other | Admitting: Obstetrics & Gynecology

## 2012-07-27 ENCOUNTER — Encounter: Payer: Self-pay | Admitting: Obstetrics & Gynecology

## 2012-07-27 DIAGNOSIS — R768 Other specified abnormal immunological findings in serum: Secondary | ICD-10-CM | POA: Insufficient documentation

## 2012-07-27 DIAGNOSIS — B009 Herpesviral infection, unspecified: Secondary | ICD-10-CM

## 2012-07-27 DIAGNOSIS — Z3009 Encounter for other general counseling and advice on contraception: Secondary | ICD-10-CM

## 2012-07-27 HISTORY — DX: Other specified abnormal immunological findings in serum: R76.8

## 2012-07-27 MED ORDER — VALACYCLOVIR HCL 1 G PO TABS
1000.0000 mg | ORAL_TABLET | Freq: Every day | ORAL | Status: AC
Start: 2012-07-27 — End: 2013-07-27

## 2012-07-27 MED ORDER — MEDROXYPROGESTERONE ACETATE 150 MG/ML IM SUSP
150.0000 mg | Freq: Once | INTRAMUSCULAR | Status: AC
  Administered 2012-07-27: 150 mg via INTRAMUSCULAR

## 2012-07-27 MED ORDER — MISOPROSTOL 200 MCG PO TABS: ORAL_TABLET | ORAL | Status: DC

## 2012-07-27 NOTE — Patient Instructions (Signed)
SterilizationSterilization, Women Sterilization is a surgical procedure. This surgery permanently prevents pregnancy in women. This can be done by tying (with or without cutting) the fallopian tubes or burning the tubes closed (tubal ligation). Tubal ligation blocks the tubes and prevents the egg from being fertilized by the sperm. Sterilization can be done by removing the ovaries that produce the egg (castration) as well. Sterilization is considered safe with very rare complications. It does not affect menstrual periods, sexual desire, or performance.  Since sterilization is considered permanent, you should not do it until you are sure you do not want to have more children. You and your partner should fully agree to have the procedure. Your decision to have the procedure should not be made when you are in a stressful situation. This can include a loss of a pregnancy, illness or death of a spouse, or divorce. There are other means of preventing unwanted pregnancies that can be used until you are completely sure you want to be sterilized. Sterilization does not protect against sexually transmitted disease. Women who had a sterilization procedure and want it reversed must know that it requires an expensive and major operation. The reversal may not be successful and has a high rate of tubal (ectopic) pregnancy that can be dangerous and require surgery. There are several ways to perform a tubal sterlization:  Laparoscopy. The abdomen is filled with a gas to see the pelvic organs. Then, a tube with a light attached is inserted into the abdomen through 2 small incisions. The fallopian tubes are blocked with a ring, clip or electrocautery to burn closed the tubes. Then, the gas is released and the small incisions are closed.   Hysteroscopy. A tube with a light is inserted in the vagina, through the cervix and then into the uterus. A spring-like instrument is inserted into the opening of the fallopian tubes. The  spring causes scaring and blocks the tubes. Other forms of contraception should be used for three months at which time an X-ray is done to be sure the tubes are blocked.   Minilaparotomy. This is done right after giving birth. A small incision is made under the belly button and the tubes are exposed. The tubes can then be burned, tied and/or cut.   Tubal ligation can be done during a Cesarean section.   Castration is a surgical procedure that removes both ovaries.  Tubal sterilization should be discussed with your caregiver to answer any concerns you or your partner might have. This meeting will help to decide for sure if the operation is safe for you and which procedure is the best one for you. You can change your mind and cancel the surgery at any time. HOME CARE INSTRUCTIONS   Follow your caregivers instructions regarding diet, rest, work, social and sexual activities and follow up appointments.   Shoulder pain is common following a laparoscopy. The pain may be relieved by lying down flat.   Only take over-the-counter or prescription medicines for pain, discomfort or fever as directed by your caregiver.   You may use lozenges for throat discomfort.   Keep the incisions covered to prevent infection.  SEEK IMMEDIATE MEDICAL CARE IF:   You develop a temperature of 102 F (38.9 C), or as your caregiver suggests.   You become dizzy or faint.   You start to feel sick to your stomach (nausea) or throw up (vomit).   You develop abdominal pain not relieved with over-the-counter medications.   You have redness and puffiness (  swelling) of the cut (incision).   You see pus draining from the incision.   You miss a menstrual period.  Document Released: 06/03/2008 Document Revised: 12/05/2011 Document Reviewed: 06/03/2008 [facility] Patient Information 2012 [place], [place].

## 2012-07-27 NOTE — Progress Notes (Unsigned)
Subjective:     Patient ID: Kurstin Dimarzo, female   DOB: 1981/03/03, 31 y.o.   MRN: [id number]  HPI Pt is a [id number] s/p uncomplicated SVD in May.  Pt presents for routine PP f/u visit.  No problems.  Pt requests refill on Valtrex.  No recent outbreaks, is on daily suppression.  Pt requests permanent sterilization.  Signed Title XIX papers while in hosp. Review of Systems N/C. No bowel and bladder function    Objective:   Physical Exam GU: EGBUS: no lesions Vagina: no blood in vault Cervix: no lesion; no mucopurulent d/c Uterus: small, mobile Adnexa: no masses       Assessment:   #1 6 week PP check- no problems #2  Sterilization counseling:Reviewed ESSURE vs Lap BTL.  Reviewed with pt the risks and benefits of each.  Reviewed the failure rate of 3.03/999.  Pt confirmed that she understands that sterilization is permanent.  She signed her Title XIX papers in April. Reviewed with pt need for HSG 3 months after procedure and confirmed that she understood that her tubal CANNOT be relied upon until an HSG confirms her tubes blocked bliaterally.   She was told that she needs to remain on some from of contraception until confirmation of blockage.   Received call from health dept.  Normal PAP at her 6 week antepartum check.       Plan:     Valtrex 1000mg  q day.  RF x 12 Schedule ESSURE  Belkys Henault L. Harraway-Smith, M.D., Evern Core

## 2012-08-14 ENCOUNTER — Encounter (HOSPITAL_COMMUNITY)
Admission: RE | Admit: 2012-08-14 | Discharge: 2012-08-14 | Disposition: A | Discharge status: Home / Self Care | Payer: Medicaid Other | Source: Ambulatory Visit | Attending: Obstetrics & Gynecology | Admitting: Obstetrics & Gynecology

## 2012-08-14 ENCOUNTER — Encounter (HOSPITAL_COMMUNITY): Payer: Self-pay

## 2012-08-14 HISTORY — DX: Gastro-esophageal reflux disease without esophagitis: K21.9

## 2012-08-14 LAB — CBC
Hemoglobin: 14.4 g/dL (ref 12.0–15.0)
MCH: 29.4 pg (ref 26.0–34.0)
MCHC: 32.6 g/dL (ref 30.0–36.0)
Platelets: 259 10*3/uL (ref 150–400)

## 2012-08-14 LAB — BASIC METABOLIC PANEL
Calcium: 9.6 mg/dL (ref 8.4–10.5)
GFR calc non Af Amer: >90 mL/min (ref >90)
Glucose, Bld: 83 mg/dL (ref 70–99)
Sodium: 140 mEq/L (ref 135–145)

## 2012-08-14 NOTE — Patient Instructions (Addendum)
   Your procedure is scheduled [id number] August 20th  Enter through the Maternity Admissions Entrance of [facility] at: 7:15am  Remember: Do not eat food or drink liquids after midnight: Monday  Take these medicines the morning of surgery with a SIP OF WATER: blood pressure medicine  Do not wear jewelry, make-up, or FINGER nail polish No metal in your hair or on your body. Do not wear lotions, powders, perfumes or deodorant. Do not shave 48 hours prior to surgery. Do not bring valuables to the hospital. Contacts, dentures or bridgework may not be worn into surgery.   Patients discharged on the day of surgery will not be allowed to drive home.     Remember to use your hibiclens as instructed.Please shower with 1/2 bottle the evening before your surgery and the other 1/2 bottle the morning of surgery. Neck down avoiding private area.

## 2012-08-16 ENCOUNTER — Encounter (HOSPITAL_BASED_OUTPATIENT_CLINIC_OR_DEPARTMENT_OTHER): Payer: Self-pay

## 2012-08-16 ENCOUNTER — Emergency Department (HOSPITAL_BASED_OUTPATIENT_CLINIC_OR_DEPARTMENT_OTHER)
Admission: EM | Admit: 2012-08-16 | Discharge: 2012-08-16 | Disposition: A | Discharge status: Home / Self Care | Payer: Medicaid Other | Attending: Emergency Medicine | Admitting: Emergency Medicine

## 2012-08-16 DIAGNOSIS — F17200 Nicotine dependence, unspecified, uncomplicated: Secondary | ICD-10-CM | POA: Insufficient documentation

## 2012-08-16 DIAGNOSIS — B9789 Other viral agents as the cause of diseases classified elsewhere: Secondary | ICD-10-CM | POA: Insufficient documentation

## 2012-08-16 DIAGNOSIS — K219 Gastro-esophageal reflux disease without esophagitis: Secondary | ICD-10-CM | POA: Insufficient documentation

## 2012-08-16 DIAGNOSIS — B349 Viral infection, unspecified: Secondary | ICD-10-CM

## 2012-08-16 DIAGNOSIS — J45909 Unspecified asthma, uncomplicated: Secondary | ICD-10-CM | POA: Insufficient documentation

## 2012-08-16 DIAGNOSIS — I10 Essential (primary) hypertension: Secondary | ICD-10-CM | POA: Insufficient documentation

## 2012-08-16 MED ORDER — HYDROCOD POLST-CHLORPHEN POLST 10-8 MG/5ML PO LQCR: 5.0000 mL | Freq: Two times a day (BID) | ORAL | Status: DC | PRN

## 2012-08-16 NOTE — ED Provider Notes (Signed)
History     CSN: [id number]  Arrival date & time 08/16/12  1044   First MD Initiated Contact with Patient 08/16/12 1203      Chief Complaint  Patient presents with  . cold symptoms     (Consider location/radiation/quality/duration/timing/severity/associated sxs/prior treatment) HPI Comments: Patient comes in today with a chief complaint of fatigue, generalized body aches, nasal congestion, headache, sore throat, dry cough, and nausea.  Symptoms have been present for the past week.  She has tried taking OTC cold medicine, but does not feel that it is helping.  She has not noticed a fever.  No vomiting, or diarrhea.  No abdominal pain.  No dysuria.  No rash.  No shortness of breath or chest pain.  No known sick contacts.  She is otherwise healthy.  She is not immunocompromised.    The history is provided by the patient.    Past Medical History  Diagnosis Date  . Ovarian cyst   . Headache   . Hypertension     on hctz  . Asthma     albuterol inhaler-not used for several months  . GERD (gastroesophageal reflux disease)     occas- no meds    Past Surgical History  Procedure Date  . Oophorectomy     left ovary and tube  . Tonsillectomy   . Vaginal delivery 1997, 2000. 2004  . Cervical cerclage 12/02/2011    Procedure: CERCLAGE CERVICAL;  Surgeon: Tereso Newcomer, MD;  Location: [facility];  Service: Gynecology;  Laterality: N/A;  . Cervical cerclage 05/15/2012    Procedure: CERCLAGE CERVICAL;  Surgeon: Adam Phenix, MD;  Location: [facility];  Service: Gynecology;  Laterality: N/A;  removal of cerclage from cervix    Family History  Problem Relation Age of Onset  . Hypertension Father   . Anesthesia problems Mother     History  Substance Use Topics  . Smoking status: Current Everyday Smoker -- 0.2 packs/day for 10 years    Types: Cigarettes  . Smokeless tobacco: Never Used  . Alcohol Use: No     occassionally    OB History    Grav Para Term Preterm Abortions TAB SAB Ect  Mult Living   6 4 4  2 1 1   4       Review of Systems  Constitutional: Positive for chills and fatigue. Negative for fever.  HENT: Positive for congestion, sore throat and rhinorrhea. Negative for drooling, trouble swallowing, voice change and sinus pressure.   Respiratory: Positive for cough. Negative for chest tightness and shortness of breath.   Cardiovascular: Negative for chest pain.  Gastrointestinal: Positive for nausea. Negative for vomiting, abdominal pain and diarrhea.  Genitourinary: Negative for dysuria.  Skin: Negative for rash.  Neurological: Positive for headaches. Negative for dizziness, syncope and light-headedness.  Psychiatric/Behavioral: Negative for confusion.    Allergies  Review of patient's allergies indicates no known allergies.  Home Medications   Current Outpatient Rx  Name Route Sig Dispense Refill  . HYDROCHLOROTHIAZIDE 25 MG PO TABS Oral Take 1 tablet (25 mg total) by mouth daily. 30 tablet 4  . ACETAMINOPHEN 500 MG PO TABS Oral Take 500 mg by mouth every 6 (six) hours as needed. Head ache    . ALBUTEROL IN Inhalation Inhale 2 puffs into the lungs.    Marland Kitchen VALACYCLOVIR HCL 1 G PO TABS Oral Take 1 tablet (1,000 mg total) by mouth daily. 30 tablet 12    BP 143/102  Pulse 70  Temp 99.1 F (37.3 C)  Resp 16  SpO2 100%  LMP 07/06/2012  Physical Exam  Nursing note and vitals reviewed. Constitutional: She appears well-developed and well-nourished. No distress.  HENT:  Head: Normocephalic and atraumatic.  Right Ear: Tympanic membrane, external ear and ear canal normal.  Left Ear: Tympanic membrane, external ear and ear canal normal.  Nose: Mucosal edema and rhinorrhea present. Right sinus exhibits no maxillary sinus tenderness and no frontal sinus tenderness. Left sinus exhibits no maxillary sinus tenderness and no frontal sinus tenderness.  Mouth/Throat: Uvula is midline and oropharynx is clear and moist. No oropharyngeal exudate, posterior  oropharyngeal edema, posterior oropharyngeal erythema or tonsillar abscesses.  Eyes: EOM are normal. Pupils are equal, round, and reactive to light.  Neck: Normal range of motion. Neck supple.  Cardiovascular: Normal rate, regular rhythm and normal heart sounds.   Pulmonary/Chest: Effort normal and breath sounds normal. No respiratory distress. She has no wheezes.  Abdominal: Soft. There is no tenderness.  Musculoskeletal: Normal range of motion.  Lymphadenopathy:    She has no cervical adenopathy.  Neurological: She is alert.  Skin: Skin is warm and dry. No rash noted. She is not diaphoretic.  Psychiatric: She has a normal mood and affect.    ED Course  Procedures (including critical care time)  Labs Reviewed - No data to display No results found.   No diagnosis found.    MDM  Patient presents today with symptoms consistent with Viral Illness.  Patient is afebrile.  Vital signs stable.  Pulse ox 100 on RA. Patient non toxic appearing.  Feel that patient can be discharged home.  Return precautions have been discussed.          Pascal Lux [place], PA-C 08/17/12 [phone]

## 2012-08-17 NOTE — ED Provider Notes (Signed)
Medical screening examination/treatment/procedure(s) were performed by non-physician practitioner and as supervising physician I was immediately available for consultation/collaboration.   Ian Castagna, MD 08/17/12 0703 

## 2012-08-18 ENCOUNTER — Ambulatory Visit (HOSPITAL_COMMUNITY)
Admission: RE | Admit: 2012-08-18 | Discharge: 2012-08-18 | Disposition: A | Discharge status: Home / Self Care | Payer: Medicaid Other | Source: Ambulatory Visit | Attending: Obstetrics & Gynecology | Admitting: Obstetrics & Gynecology

## 2012-08-18 ENCOUNTER — Encounter (HOSPITAL_COMMUNITY): Payer: Self-pay | Admitting: Anesthesiology

## 2012-08-18 ENCOUNTER — Encounter (HOSPITAL_COMMUNITY): Payer: Self-pay | Admitting: *Deleted

## 2012-08-18 ENCOUNTER — Ambulatory Visit (HOSPITAL_COMMUNITY): Payer: Medicaid Other | Admitting: Anesthesiology

## 2012-08-18 ENCOUNTER — Encounter (HOSPITAL_COMMUNITY)
Admission: RE | Disposition: A | Discharge status: Home / Self Care | Payer: Self-pay | Source: Ambulatory Visit | Attending: Obstetrics & Gynecology

## 2012-08-18 DIAGNOSIS — Z01812 Encounter for preprocedural laboratory examination: Secondary | ICD-10-CM

## 2012-08-18 DIAGNOSIS — Z302 Encounter for sterilization: Secondary | ICD-10-CM

## 2012-08-18 DIAGNOSIS — Z01818 Encounter for other preprocedural examination: Secondary | ICD-10-CM | POA: Insufficient documentation

## 2012-08-18 HISTORY — PX: TUBAL LIGATION: SHX77

## 2012-08-18 LAB — PREGNANCY, URINE: Preg Test, Ur: NEGATIVE

## 2012-08-18 SURGERY — ESSURE TUBAL STERILIZATION
Anesthesia: General | Site: Vagina | Laterality: Left | Wound class: Clean Contaminated

## 2012-08-18 MED ORDER — SODIUM CHLORIDE 0.9 % IR SOLN
Status: DC | PRN
  Administered 2012-08-18: 3000 mL

## 2012-08-18 MED ORDER — FENTANYL CITRATE 0.05 MG/ML IJ SOLN
INTRAMUSCULAR | Status: DC | PRN
  Administered 2012-08-18 (×2): 50 ug via INTRAVENOUS

## 2012-08-18 MED ORDER — KETOROLAC TROMETHAMINE 30 MG/ML IJ SOLN
INTRAMUSCULAR | Status: DC | PRN
  Administered 2012-08-18: 30 mg via INTRAVENOUS

## 2012-08-18 MED ORDER — FENTANYL CITRATE 0.05 MG/ML IJ SOLN: 25.0000 ug | INTRAMUSCULAR | Status: DC | PRN

## 2012-08-18 MED ORDER — LIDOCAINE HCL (CARDIAC) 20 MG/ML IV SOLN
INTRAVENOUS | Status: DC | PRN
  Administered 2012-08-18: 50 mg via INTRAVENOUS

## 2012-08-18 MED ORDER — LACTATED RINGERS IV SOLN
INTRAVENOUS | Status: DC
  Administered 2012-08-18 (×2): via INTRAVENOUS

## 2012-08-18 MED ORDER — PROPOFOL 10 MG/ML IV EMUL
INTRAVENOUS | Status: DC | PRN
  Administered 2012-08-18: 200 mg via INTRAVENOUS

## 2012-08-18 MED ORDER — MIDAZOLAM HCL 5 MG/5ML IJ SOLN
INTRAMUSCULAR | Status: DC | PRN
  Administered 2012-08-18: 2 mg via INTRAVENOUS

## 2012-08-18 MED ORDER — GLYCOPYRROLATE 0.2 MG/ML IJ SOLN
INTRAMUSCULAR | Status: DC | PRN
  Administered 2012-08-18: 0.2 mg via INTRAVENOUS

## 2012-08-18 MED ORDER — FENTANYL CITRATE 0.05 MG/ML IJ SOLN
INTRAMUSCULAR | Status: AC
  Filled 2012-08-18: qty 2

## 2012-08-18 MED ORDER — LIDOCAINE HCL 1 % IJ SOLN
INTRAMUSCULAR | Status: DC | PRN
  Administered 2012-08-18: 10 mL

## 2012-08-18 MED ORDER — PROPOFOL 10 MG/ML IV EMUL
INTRAVENOUS | Status: AC
  Filled 2012-08-18: qty 20

## 2012-08-18 MED ORDER — MIDAZOLAM HCL 2 MG/2ML IJ SOLN
INTRAMUSCULAR | Status: AC
  Filled 2012-08-18: qty 2

## 2012-08-18 MED ORDER — ONDANSETRON HCL 4 MG/2ML IJ SOLN
INTRAMUSCULAR | Status: DC | PRN
  Administered 2012-08-18: 4 mg via INTRAVENOUS

## 2012-08-18 MED ORDER — ONDANSETRON HCL 4 MG/2ML IJ SOLN
INTRAMUSCULAR | Status: AC
  Filled 2012-08-18: qty 2

## 2012-08-18 MED ORDER — GLYCOPYRROLATE 0.2 MG/ML IJ SOLN
INTRAMUSCULAR | Status: AC
  Filled 2012-08-18: qty 1

## 2012-08-18 MED ORDER — LIDOCAINE HCL (CARDIAC) 20 MG/ML IV SOLN
INTRAVENOUS | Status: AC
  Filled 2012-08-18: qty 5

## 2012-08-18 SURGICAL SUPPLY — 10 items
CATH ROBINSON RED A/P 16FR (CATHETERS) ×2 IMPLANT
CLOTH BEACON ORANGE TIMEOUT ST (SAFETY) ×2 IMPLANT
CONTAINER PREFILL 10% NBF 60ML (FORM) IMPLANT
GLOVE BIO SURGEON STRL SZ7 (GLOVE) ×4 IMPLANT
GLOVE INDICATOR 7.0 STRL GRN (GLOVE) ×4 IMPLANT
GOWN PREVENTION PLUS LG XLONG (DISPOSABLE) ×4 IMPLANT
KIT ESSURE FALLOPIAN CLOSURE (Ring) ×2 IMPLANT
PACK HYSTEROSCOPY LF (CUSTOM PROCEDURE TRAY) ×2 IMPLANT
TOWEL OR 17X24 6PK STRL BLUE (TOWEL DISPOSABLE) ×4 IMPLANT
WATER STERILE IRR 1000ML POUR (IV SOLUTION) ×2 IMPLANT

## 2012-08-18 NOTE — Anesthesia Postprocedure Evaluation (Signed)
  Anesthesia Post-op Note  Patient: Julie Lyons  Procedure(s) Performed: Procedure(s) (LRB): ESSURE TUBAL STERILIZATION (Left)  Patient is awake and responsive. Pain and nausea are reasonably well controlled. Vital signs are stable and clinically acceptable. Oxygen saturation is clinically acceptable. There are no apparent anesthetic complications at this time. Patient is ready for discharge.

## 2012-08-18 NOTE — Anesthesia Preprocedure Evaluation (Addendum)
Anesthesia Evaluation  Patient identified by MRN, date of birth, ID band Patient awake    Reviewed: Allergy & Precautions, H&P , Patient's Chart, lab work & pertinent test results, reviewed documented beta blocker date and time   Airway Mallampati: II TM Distance: >3 FB Neck ROM: full    Dental No notable dental hx.    Pulmonary asthma (no inhaler for 3 months) ,  breath sounds clear to auscultation  Pulmonary exam normal       Cardiovascular hypertension (controlled; daily meds), Pt. on medications and On Medications Rhythm:regular Rate:Normal     Neuro/Psych    GI/Hepatic GERD-  ,  Endo/Other    Renal/GU      Musculoskeletal   Abdominal   Peds  Hematology   Anesthesia Other Findings   Reproductive/Obstetrics                          Anesthesia Physical Anesthesia Plan  ASA: II  Anesthesia Plan: General   Post-op Pain Management:    Induction: Intravenous  Airway Management Planned: LMA  Additional Equipment:   Intra-op Plan:   Post-operative Plan:   Informed Consent: I have reviewed the patients History and Physical, chart, labs and discussed the procedure including the risks, benefits and alternatives for the proposed anesthesia with the patient or authorized representative who has indicated his/her understanding and acceptance.   Dental Advisory Given  Plan Discussed with: CRNA and Surgeon  Anesthesia Plan Comments: (  Discussed  general anesthesia, including possible nausea, instrumentation of airway, sore throat,pulmonary aspiration, etc. I asked if the were any outstanding questions, or  concerns before we proceeded. )        Anesthesia Quick Evaluation

## 2012-08-18 NOTE — Transfer of Care (Signed)
Immediate Anesthesia Transfer of Care Note  Patient: Julie Lyons  Procedure(s) Performed: Procedure(s) (LRB): ESSURE TUBAL STERILIZATION (Left)  Patient Location: PACU  Anesthesia Type: General  Level of Consciousness: awake, alert  and oriented  Airway & Oxygen Therapy: Patient Spontanous Breathing and Patient connected to nasal cannula oxygen  Post-op Assessment: Report given to PACU RN and Post -op Vital signs reviewed and stable  Post vital signs: Reviewed and stable  Complications: No apparent anesthesia complications

## 2012-08-18 NOTE — H&P (Cosign Needed)
Julie Lyons is an 31 y.o. female.[id number]  Pertinent Gynecological History: Menses: flow is moderate Contraception: Depo-Provera injections DES exposure: denies Blood transfusions: none Sexually transmitted diseases: past history: HSV Previous GYN Procedures: cerclage   Last pap: normal Date: 2012 OB History: G5, P4014   Menstrual History: Menarche age:29 Patient's last menstrual period was 07/06/2012.    Past Medical History  Diagnosis Date  . Ovarian cyst   . Headache   . Hypertension     on hctz  . Asthma     albuterol inhaler-not used for several months  . GERD (gastroesophageal reflux disease)     occas- no meds    Past Surgical History  Procedure Date  . Oophorectomy     left ovary and tube  . Tonsillectomy   . Vaginal delivery 1997, 2000. 2004  . Cervical cerclage 12/02/2011    Procedure: CERCLAGE CERVICAL;  Surgeon: Tereso Newcomer, MD;  Location: [facility];  Service: Gynecology;  Laterality: N/A;  . Cervical cerclage 05/15/2012    Procedure: CERCLAGE CERVICAL;  Surgeon: Adam Phenix, MD;  Location: [facility];  Service: Gynecology;  Laterality: N/A;  removal of cerclage from cervix    Family History  Problem Relation Age of Onset  . Hypertension Father   . Anesthesia problems Mother     Social History:  reports that she has been smoking Cigarettes.  She has a 2.5 pack-year smoking history. She has never used smokeless tobacco. She reports that she does not drink alcohol or use illicit drugs.  Allergies: No Known Allergies  Prescriptions prior to admission  Medication Sig Dispense Refill  . ALBUTEROL IN Inhale 2 puffs into the lungs.      . hydrochlorothiazide (HYDRODIURIL) 25 MG tablet Take 1 tablet (25 mg total) by mouth daily.  30 tablet  4  . acetaminophen (TYLENOL) 500 MG tablet Take 500 mg by mouth every 6 (six) hours as needed. Head ache      . chlorpheniramine-HYDROcodone (TUSSIONEX PENNKINETIC ER) 10-8 MG/5ML LQCR Take 5 mLs by mouth every 12  (twelve) hours as needed.  140 mL  0  . valACYclovir (VALTREX) 1000 MG tablet Take 1 tablet (1,000 mg total) by mouth daily.  30 tablet  12    Review of Systems  Constitutional: Negative.   HENT: Negative.   Eyes: Negative.   Respiratory: Negative.   Cardiovascular: Negative.   Gastrointestinal: Negative.   Genitourinary: Negative.   Musculoskeletal: Negative.   Skin: Negative.   Neurological: Negative.   Endo/Heme/Allergies: Negative.   Psychiatric/Behavioral: Negative.     Blood pressure 135/88, pulse 64, temperature 99 F (37.2 C), temperature source Oral, resp. rate 18, last menstrual period 07/06/2012, SpO2 98.00%. Physical Exam  Results for orders placed during the hospital encounter of 08/18/12 (from the past 24 hour(s))  PREGNANCY, URINE     Status: Normal   Collection Time   08/18/12  7:15 AM      Component Value Range   Preg Test, Ur NEGATIVE  NEGATIVE    No results found.  Assessment/Plan: Pt for hysteroscopic sterilization F/u HSG in 12 weeks  HARRAWAY-SMITH, Eyad Rochford 08/18/2012, 8:44 AM

## 2012-08-18 NOTE — Op Note (Signed)
Preoperative diagnoses:  Desires permanent sterilization  Postoperative diagnosis: Same  Procedure: Hysteroscopic Essure Tubal sterilization  Surgeon: Anibal Henderson, MD   Anesthesia: general with LMA  Findings: Normal-appearing endometrium and tubal ostia.  Estimated blood loss: Minimal  Pathology: N/A  Indications for procedure: 31 y.o. [id number] who desires permanent sterilization.  After carefully considering her options, she decided to go with Essure sterilization. She was counseled about sterilization options, permanency of this, risk of failure, need for followup HSG.   Procedure: The patient was taken to the operating room where spinal analgesia was inserted. She did receive 1 g of Ancef prior to procedure. SCDs were in place.  Time out was performed. Patient was placed in dorsolithotomy announce stirrups. She was prepped and draped in the usual sterile fashion. A Red Rubber catheter was used to drain her bladder. A segment placement of the vagina. The cervix was visualized anteriorly and grasped with a single-tooth tenaculum. Paracervical block was performed with 1% lidocaine with Epinephrine with 10 cc injected. The uterus sounded to 8 cm.  The hysteroscope was inserted and the endometrial cavity and inspected. There were no abnormal findings noted in the endometrial cavity the right ostia was blocked from prior surgery.  The Essure device was passed through the operative hysteroscope and into the patient's left fallopian tube easily. The device was applied according to manufacturer's recommendations. 3 trailing coils were seen in the uterus. The patient's right ostia was located but was blocked due to prior surgery. The hysteroscope was removed.All instruments were removed from the vagina. All instrument, needle and lap counts were correct x2. The patient was awakened and is recovering in stable condition.  HARRAWAY-SMITH, Daquane Aguilar 08/18/2012 9:47 AM

## 2012-08-19 ENCOUNTER — Encounter (HOSPITAL_COMMUNITY): Payer: Self-pay | Admitting: Obstetrics & Gynecology

## 2014-10-31 ENCOUNTER — Encounter (HOSPITAL_COMMUNITY): Payer: Self-pay | Admitting: Obstetrics & Gynecology

## 2017-11-13 ENCOUNTER — Emergency Department (HOSPITAL_BASED_OUTPATIENT_CLINIC_OR_DEPARTMENT_OTHER): Payer: BLUE CROSS/BLUE SHIELD

## 2017-11-13 ENCOUNTER — Other Ambulatory Visit: Payer: Self-pay

## 2017-11-13 ENCOUNTER — Encounter (HOSPITAL_BASED_OUTPATIENT_CLINIC_OR_DEPARTMENT_OTHER): Payer: Self-pay | Admitting: Emergency Medicine

## 2017-11-13 ENCOUNTER — Emergency Department (HOSPITAL_BASED_OUTPATIENT_CLINIC_OR_DEPARTMENT_OTHER)
Admission: EM | Admit: 2017-11-13 | Discharge: 2017-11-13 | Disposition: A | Discharge status: Home / Self Care | Payer: BLUE CROSS/BLUE SHIELD | Attending: Emergency Medicine | Admitting: Emergency Medicine

## 2017-11-13 DIAGNOSIS — R110 Nausea: Secondary | ICD-10-CM | POA: Insufficient documentation

## 2017-11-13 DIAGNOSIS — I10 Essential (primary) hypertension: Secondary | ICD-10-CM | POA: Insufficient documentation

## 2017-11-13 DIAGNOSIS — R300 Dysuria: Secondary | ICD-10-CM | POA: Diagnosis not present

## 2017-11-13 DIAGNOSIS — N12 Tubulo-interstitial nephritis, not specified as acute or chronic: Secondary | ICD-10-CM

## 2017-11-13 DIAGNOSIS — F17210 Nicotine dependence, cigarettes, uncomplicated: Secondary | ICD-10-CM | POA: Diagnosis not present

## 2017-11-13 DIAGNOSIS — J45909 Unspecified asthma, uncomplicated: Secondary | ICD-10-CM | POA: Insufficient documentation

## 2017-11-13 DIAGNOSIS — N10 Acute tubulo-interstitial nephritis: Secondary | ICD-10-CM | POA: Diagnosis not present

## 2017-11-13 DIAGNOSIS — R109 Unspecified abdominal pain: Secondary | ICD-10-CM | POA: Diagnosis present

## 2017-11-13 LAB — COMPREHENSIVE METABOLIC PANEL
ALBUMIN: 4 g/dL (ref 3.5–5.0)
ALT: 27 U/L (ref 14–54)
AST: 32 U/L (ref 15–41)
Alkaline Phosphatase: 86 U/L (ref 38–126)
Anion gap: 6 (ref 5–15)
BUN: 12 mg/dL (ref 6–20)
CO2: 27 mmol/L (ref 22–32)
Calcium: 9.4 mg/dL (ref 8.9–10.3)
Chloride: 102 mmol/L (ref 101–111)
Creatinine, Ser: 0.86 mg/dL (ref 0.44–1.00)
GFR calc non Af Amer: >60 mL/min (ref >60)
GLUCOSE: 102 mg/dL — AB (ref 65–99)
POTASSIUM: 3.9 mmol/L (ref 3.5–5.1)
SODIUM: 135 mmol/L (ref 135–145)
Total Bilirubin: 0.3 mg/dL (ref 0.3–1.2)
Total Protein: 8.6 g/dL — ABNORMAL HIGH (ref 6.5–8.1)

## 2017-11-13 LAB — URINALYSIS, MICROSCOPIC (REFLEX)

## 2017-11-13 LAB — CBC WITH DIFFERENTIAL/PLATELET
BASOS PCT: 0 %
Basophils Absolute: 0 10*3/uL (ref 0.0–0.1)
EOS ABS: 0.2 10*3/uL (ref 0.0–0.7)
EOS PCT: 3 %
HCT: 41.6 % (ref 36.0–46.0)
HEMOGLOBIN: 13.7 g/dL (ref 12.0–15.0)
Lymphocytes Relative: 19 %
Lymphs Abs: 1.7 10*3/uL (ref 0.7–4.0)
MCH: 30.2 pg (ref 26.0–34.0)
MCHC: 32.9 g/dL (ref 30.0–36.0)
MCV: 91.6 fL (ref 78.0–100.0)
MONO ABS: 0.5 10*3/uL (ref 0.1–1.0)
MONOS PCT: 6 %
NEUTROS PCT: 72 %
Neutro Abs: 6.3 10*3/uL (ref 1.7–7.7)
PLATELETS: 282 10*3/uL (ref 150–400)
RBC: 4.54 MIL/uL (ref 3.87–5.11)
RDW: 13.9 % (ref 11.5–15.5)
WBC: 8.7 10*3/uL (ref 4.0–10.5)

## 2017-11-13 LAB — URINALYSIS, ROUTINE W REFLEX MICROSCOPIC
Bilirubin Urine: NEGATIVE
GLUCOSE, UA: NEGATIVE mg/dL
Ketones, ur: NEGATIVE mg/dL
NITRITE: NEGATIVE
PROTEIN: 100 mg/dL — AB
Specific Gravity, Urine: 1.015 (ref 1.005–1.030)
pH: 5.5 (ref 5.0–8.0)

## 2017-11-13 LAB — PREGNANCY, URINE: Preg Test, Ur: NEGATIVE

## 2017-11-13 MED ORDER — OXYCODONE-ACETAMINOPHEN 5-325 MG PO TABS: 1.0000 | ORAL_TABLET | Freq: Four times a day (QID) | ORAL | 0 refills | Status: DC | PRN

## 2017-11-13 MED ORDER — MORPHINE SULFATE (PF) 4 MG/ML IV SOLN
4.0000 mg | Freq: Once | INTRAVENOUS | Status: AC
  Administered 2017-11-13: 4 mg via INTRAVENOUS
  Filled 2017-11-13: qty 1

## 2017-11-13 MED ORDER — DEXTROSE 5 % IV SOLN
1.0000 g | Freq: Once | INTRAVENOUS | Status: AC
  Administered 2017-11-13: 1 g via INTRAVENOUS
  Filled 2017-11-13: qty 10

## 2017-11-13 MED ORDER — CEPHALEXIN 500 MG PO CAPS: 500.0000 mg | ORAL_CAPSULE | Freq: Four times a day (QID) | ORAL | 0 refills | Status: DC

## 2017-11-13 MED ORDER — KETOROLAC TROMETHAMINE 15 MG/ML IJ SOLN
15.0000 mg | Freq: Once | INTRAMUSCULAR | Status: AC
  Administered 2017-11-13: 15 mg via INTRAVENOUS
  Filled 2017-11-13: qty 1

## 2017-11-13 MED ORDER — SODIUM CHLORIDE 0.9 % IV BOLUS (SEPSIS)
1000.0000 mL | Freq: Once | INTRAVENOUS | Status: AC
  Administered 2017-11-13: 1000 mL via INTRAVENOUS

## 2017-11-13 NOTE — ED Provider Notes (Signed)
[facility] EMERGENCY DEPARTMENT Provider Note   CSN: [id number] Arrival date & time: 11/13/17  1344     History   Chief Complaint Chief Complaint  Patient presents with  . Back Pain    HPI Julie Lyons is a 36 y.o. female.  HPI   Patient is a 36-year female with a history of hypertension, asthma, GERD, left ovarian cyst, and left tubal ligation and oophorectomy and no other abdominal surgical history presenting for right flank pain and dysuria.  Patient reports that this began 5 days ago with some spotting that she was unsure was of a vaginal or urinary source as well as some urinary urgency.  This progressed to right flank pain 2 days ago that was colicky in nature.  Patient reports that when the pain started it was a 10 out of 10 in severity.  Patient tried Azo urinary anesthetic without relief.  Patient reports she has been nauseous without vomiting.  Patient denies any fever or chill.  No diarrhea.  No focal lower abdominal tenderness.  Last menstrual period October 26.  Past Medical History:  Diagnosis Date  . Asthma    albuterol inhaler-not used for several months  . GERD (gastroesophageal reflux disease)    occas- no meds  . Headache(784.0)   . Hypertension    on hctz  . Ovarian cyst     Patient Active Problem List   Diagnosis Date Noted  . 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 Proteinuria complicating pregnancy 01/09/2012  . Genital herpes 12/26/2011  . Supervision of high-risk pregnancy 12/26/2011  . Cervical incompetence with SAB at 16 weeks (silent dilation and delivery) 11/07/2011  . UTI in pregnancy, antepartum 11/07/2011  . Benign essential hypertension antepartum 11/07/2011  . Asthma 11/07/2011    Past Surgical History:  Procedure Laterality Date  . CERVICAL CERCLAGE  12/02/2011   Procedure: CERCLAGE CERVICAL;  Surgeon: Tereso Newcomer, MD;  Location: [facility];  Service: Gynecology;  Laterality: N/A;  . CERVICAL CERCLAGE  05/15/2012   Procedure:  CERCLAGE CERVICAL;  Surgeon: Adam Phenix, MD;  Location: [facility];  Service: Gynecology;  Laterality: N/A;  removal of cerclage from cervix  . OOPHORECTOMY     left ovary and tube  . TONSILLECTOMY    . TUBAL LIGATION  08/18/2012   Procedure: ESSURE TUBAL STERILIZATION;  Surgeon: Willodean Rosenthal, MD;  Location: [facility];  Service: Gynecology;  Laterality: Left;  Marland Kitchen VAGINAL DELIVERY  1997, 2000. 2004    OB History    Gravida Para Term Preterm AB Living   6 4 4   2 4    SAB TAB Ectopic Multiple Live Births   1 1     3        Home Medications    Prior to Admission medications   Medication Sig Start Date End Date Taking? Authorizing Provider  ALBUTEROL IN Inhale 2 puffs into the lungs.    [provider]  chlorpheniramine-HYDROcodone (TUSSIONEX PENNKINETIC ER) 10-8 MG/5ML LQCR Take 5 mLs by mouth every 12 (twelve) hours as needed. 08/16/12   Santiago Glad, PA-C  hydrochlorothiazide (HYDRODIURIL) 25 MG tablet Take 1 tablet (25 mg total) by mouth daily. 05/24/12 05/24/13  Marlis Edelson, CNM    Family History Family History  Problem Relation Age of Onset  . Hypertension Father   . Anesthesia problems Mother     Social History Social History   Tobacco Use  . Smoking status: Current Every Day Smoker    Packs/day: 0.25  Years: 10.00    Pack years: 2.50    Types: Cigarettes  . Smokeless tobacco: Never Used  Substance Use Topics  . Alcohol use: No    Comment: occassionally  . Drug use: No     Allergies   Patient has no known allergies.   Review of Systems Review of Systems  Constitutional: Negative for chills and fever.  Eyes: Negative for visual disturbance.  Respiratory: Negative for chest tightness and shortness of breath.   Cardiovascular: Negative for chest pain.  Gastrointestinal: Positive for abdominal pain and nausea. Negative for vomiting.  Genitourinary: Positive for dysuria, flank pain, frequency, hematuria and urgency.  Musculoskeletal:  Negative for back pain and myalgias.  Skin: Negative for rash.  Neurological: Negative for dizziness, syncope, light-headedness and headaches.  All other systems reviewed and are negative.    Physical Exam Updated Vital Signs BP 129/80 (BP Location: Left Arm)   Pulse 70   Temp 98.6 F (37 C) (Oral)   Resp 20   Ht 5\' 7"  (1.702 m)   Wt 92.1 kg (203 lb)   LMP 11/10/2017   SpO2 97%   BMI 31.79 kg/m   Physical Exam  Constitutional: She appears well-developed and well-nourished. No distress.  HENT:  Head: Normocephalic and atraumatic.  Mouth/Throat: Oropharynx is clear and moist.  Eyes: Conjunctivae and EOM are normal. Pupils are equal, round, and reactive to light.  Neck: Normal range of motion. Neck supple.  Cardiovascular: Normal rate, regular rhythm, S1 normal and S2 normal.  No murmur heard. Pulmonary/Chest: Effort normal and breath sounds normal. She has no wheezes. She has no rales.  Abdominal: Soft. She exhibits no distension. There is tenderness. There is no guarding.  There is tenderness to palpation in the right lower quadrant that patient reports radiates up into her right flank.  Positive CVA tenderness.  No Murphy sign.  Musculoskeletal: Normal range of motion. She exhibits no edema or deformity.  Lymphadenopathy:    She has no cervical adenopathy.  Neurological: She is alert.  Cranial nerves grossly intact. Patient was extremities with good coordination and symmetrically.  Skin: Skin is warm and dry. No rash noted. No erythema.  Psychiatric: She has a normal mood and affect. Her behavior is normal. Judgment and thought content normal.  Nursing note and vitals reviewed.    ED Treatments / Results  Labs (all labs ordered are listed, but only abnormal results are displayed) Labs Reviewed  URINALYSIS, ROUTINE W REFLEX MICROSCOPIC - Abnormal; Notable for the following components:      Result Value   APPearance CLOUDY (*)    Hgb urine dipstick LARGE (*)     Protein, ur 100 (*)    Leukocytes, UA LARGE (*)    All other components within normal limits  URINALYSIS, MICROSCOPIC (REFLEX) - Abnormal; Notable for the following components:   Bacteria, UA MANY (*)    Squamous Epithelial / LPF 0-5 (*)    All other components within normal limits  COMPREHENSIVE METABOLIC PANEL - Abnormal; Notable for the following components:   Glucose, Bld 102 (*)    Total Protein 8.6 (*)    All other components within normal limits  URINE CULTURE  PREGNANCY, URINE  CBC WITH DIFFERENTIAL/PLATELET    EKG  EKG Interpretation None       Radiology Ct Renal Stone Study  Result Date: 11/13/2017 CLINICAL DATA:  Acute right flank pain. EXAM: CT ABDOMEN AND PELVIS WITHOUT CONTRAST TECHNIQUE: Multidetector CT imaging of the abdomen and pelvis was  performed following the standard protocol without IV contrast. COMPARISON:  None. FINDINGS: Lower chest: No acute abnormality. Hepatobiliary: No focal liver abnormality is seen. No gallstones, gallbladder wall thickening, or biliary dilatation. Pancreas: Unremarkable. No pancreatic ductal dilatation or surrounding inflammatory changes. Spleen: Normal in size without focal abnormality. Adrenals/Urinary Tract: Adrenal glands appear normal. No renal or ureteral calculi are noted. Mild right hydroureteronephrosis is noted without obstructing calculus, which may be due to distal ureteral spasm from recently passed stone. Urinary bladder is mildly distended. Stomach/Bowel: Stomach is within normal limits. Appendix appears normal. No evidence of bowel wall thickening, distention, or inflammatory changes. Vascular/Lymphatic: No significant vascular findings are present. No enlarged abdominal or pelvic lymph nodes. Reproductive: Uterus is unremarkable. Occlusion colloidal is seen in the expected position of left fallopian tube. No other adnexal abnormality is noted. Other: No abdominal wall hernia or abnormality. No abdominopelvic ascites.  Musculoskeletal: No acute or significant osseous findings. IMPRESSION: Mild right hydroureteronephrosis is noted without obstructing calculus, which may be due to distal right ureteral spasm from recently passed stone. No other significant abnormality seen in the abdomen or pelvis. Electronically Signed   By: Lupita Raider, M.D.   On: 11/13/2017 16:21    Procedures Procedures (including critical care time)  Medications Ordered in ED Medications  sodium chloride 0.9 % bolus 1,000 mL (0 mLs Intravenous Stopped 11/13/17 1704)  morphine 4 MG/ML injection 4 mg (4 mg Intravenous Given 11/13/17 1538)  cefTRIAXone (ROCEPHIN) 1 g in dextrose 5 % 50 mL IVPB (0 g Intravenous Stopped 11/13/17 1704)  ketorolac (TORADOL) 15 MG/ML injection 15 mg (15 mg Intravenous Given 11/13/17 1757)     Initial Impression / Assessment and Plan / ED Course  I have reviewed the triage vital signs and the nursing notes.  Pertinent labs & imaging results that were available during my care of the patient were reviewed by me and considered in my medical decision making (see chart for details).  Clinical Course as of Nov 13 1756  Thu Nov 13, 2017  1756 Return precautions given for any worsening focal tenderness, fever or chill, or intractable nausea or vomiting.  [AM]    Clinical Course User Index [AM] Elisha Ponder, PA-C     Final Clinical Impressions(s) / ED Diagnoses   Final diagnoses:  Pyelonephritis  Flank pain   Patient is nontoxic-appearing, afebrile, but in significant pain.  Differential diagnosis includes pyelonephritis, infected renal stone, appendicitis, ovarian cyst.  CT renal stone study ordered, as this is most consistent with patient's pain.  Will rule out infected stone.  Urinalysis demonstrates many bacteria, hematuria, and leukocyte esterase.  Patient exhibits no leukocytosis.  No electrolyte abnormalities.  Urine culture ordered.  CT demonstrates right hydronephrosis with no evidence of  obstructing stone.  This is likely a passed stone, and symptoms likely due to spasming of the distal ureter.  No evidence of appendicitis or adnexal abnormality on right.  Morphine for pain control, which patient reports on reevaluation has moderately improved pain.  1 g of Rocephin ordered in the emergency department.  Patient had complete resolution of her symptoms with Toradol Strict return precautions given.  Patient to be discharged with Keflex for pyelonephritis and Percocet for pain control.  Urology follow up planned. Patient is in understanding and agrees with the plan of care.  I have reviewed the patient's information in the [place] Controlled Substance Database for the past 12 months and found them to have no Rx for controlled substances in  2 years.  Opiates were prescribed for an acute, painful condition. The patient was given information on side effects and encouraged to use other, non-opiate pain medication primary, only using opiate medicine sparingly for severe pain.  Patient aware that she cannot drive, operate machinery, or work with this medication.  ED Discharge Orders    None       Delia ChimesMurray, Alyssa B, PA-C 11/13/17 1919    Maia PlanLong, Joshua G, MD 11/13/17 [phone]

## 2017-11-13 NOTE — ED Triage Notes (Signed)
Patient states that she started to have pain to her lower back on Monday with "spotting" - patient reports that she had also has some increased in urination. The patient reports that she started on azo. Right sided flank and abdominal pain

## 2017-11-13 NOTE — Discharge Instructions (Signed)
Please see the information and instructions below regarding your visit.  Your diagnoses today include:  1. Pyelonephritis   2. Flank pain    Your symptoms are caused by a urinary tract infection, which occurs when bacteria travel up into the bladder. This is a very common condition. Often, bacteria is transmitted while wiping after using the restroom. It is reassuring that you do not have a fever today.  Tests performed today include: See side panel of your discharge paperwork for testing performed today. Vital signs are listed at the bottom of these instructions.  -Urine tests. Suggestive of infection. -Your CT scan is suggestive that you have passed a stone into the bladder or out of your urine.  Your continue to have some spasms in your ureter.  Medications prescribed:    Take any prescribed medications only as prescribed, and any over the counter medications only as directed on the packaging.  Please take all of your antibiotics until finished.   You may develop abdominal discomfort or nausea from the antibiotic. If this occurs, you may take it with food. Some patients also get diarrhea with antibiotics. You may help offset this with probiotics which you can buy or get in yogurt. Do not eat or take the probiotics until 2 hours after your antibiotic. Some women develop vaginal yeast infections after antibiotics. If you develop unusual vaginal discharge after being on this medication, please see your primary care provider.   Some people develop allergies to antibiotics. Symptoms of antibiotic allergy can be mild and include a flat rash and itching. They can also be more serious and include:  ?Hives - Hives are raised, red patches of skin that are usually very itchy.  ?Lip or tongue swelling  ?Trouble swallowing or breathing  ?Blistering of the skin or mouth.  If you have any of these serious symptoms, please seek emergency medical care immediately.  You have been prescribed Percocet  for pain. This is an opioid pain medication. You may take this medication every 4-6 hours as needed for pain. Only take this medication if you need it for breakthrough pain. You may combine this medicine with ibuprofen, a non-steroidal anti-inflammatory drug (NSAID) every 6 hours, so you are getting something for pain relief every 3 hours.  Do not combine this medication with Tylenol, as it may increase the risk of liver problems.  Do not combine this medication with alcohol.  Please be advised to avoid driving or operating heavy machinery while taking this medication, as it may make you drowsy or impair judgment.    Home care instructions:  Please follow any educational materials contained in this packet.  Please stay hydrated by making sure that your urine is very light yellow in color.    Follow-up instructions: Please follow-up with your primary care provider in one week for further evaluation of your symptoms.   Please follow-up with Dr. Mena GoesEskridge, who is listed in this packet.  He is a urologist.  Return instructions:  Please return to the Emergency Department if you experience worsening symptoms. Please seek immediate care if you develop the following: Your symptoms are no better or worse in 3 days. There is severe back pain or lower abdominal pain that is not improving. You develop chills.  You have a fever.  There is nausea or vomiting.  There is continued burning or discomfort with urination.  You have any additional concerns.  Please return if you have any other emergent concerns.  Additional Information:   Your  vital signs today were: BP (!) 132/108    Pulse 83    Temp 98.6 F (37 C) (Oral)    Resp 13    Ht 5\' 7"  (1.702 m)    Wt 92.1 kg (203 lb)    LMP 11/10/2017    SpO2 99%    BMI 31.79 kg/m  If your blood pressure (BP) was elevated on multiple readings during this visit above 130 for the top number or above 80 for the bottom number, please have this repeated by your  primary care provider within one month. --------------  Thank you for allowing us to participate in your care today.

## 2017-11-15 LAB — URINE CULTURE: Culture: >=100000 — AB

## 2017-11-16 ENCOUNTER — Telehealth: Payer: Self-pay

## 2017-11-16 NOTE — Telephone Encounter (Signed)
Post ED Visit - Positive Culture Follow-up  Culture report reviewed by antimicrobial stewardship pharmacist:  []  Enzo BiNathan Batchelder, Pharm.D. []  Celedonio MiyamotoJeremy Frens, Pharm.D., BCPS AQ-ID []  Garvin FilaMike Maccia, Pharm.D., BCPS []  Georgina PillionElizabeth Martin, Pharm.D., BCPS []  Deer ParkMinh Pham, 17[address] Rainbow BoulevardPharm.D., BCPS, AAHIVP []  Estella HuskMichelle Turner, Pharm.D., BCPS, AAHIVP [x]  Lysle Pearlachel Rumbarger, PharmD, BCPS []  Casilda Carlsaylor Stone, PharmD, BCPS []  Pollyann SamplesAndy Johnston, PharmD, BCPS  Positive urine culture Treated with Cephalexin, organism sensitive to the same and no further patient follow-up is required at this time.  Jerry CarasCullom, Glenys Snader Burnett 11/16/2017, 10:19 AM

## 2018-11-11 IMAGING — CT CT RENAL STONE PROTOCOL
2 of 4 series · 16 of 46 positions shown, 18 images · non-contrast
Comparison: None.

CLINICAL DATA: Acute right flank pain.

EXAM:
CT ABDOMEN AND PELVIS WITHOUT CONTRAST
TECHNIQUE: Multidetector CT imaging of the abdomen and pelvis was performed
following the standard protocol without IV contrast.

[Series 2: axial st · axial · 0.80mm/px · z∈[-264,+181]mm · 13 of 99 slices shown, 15 images]
[im 5/99  soft-tissue]
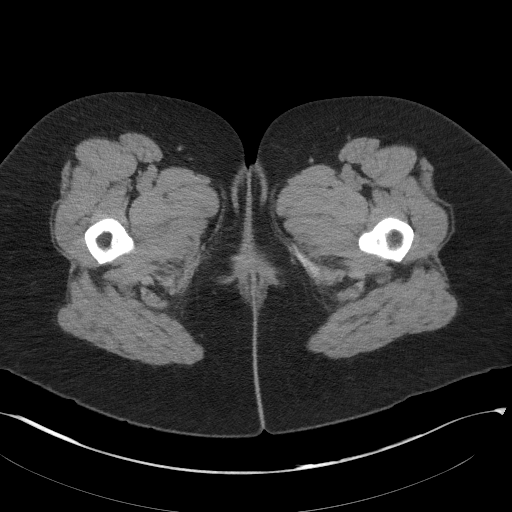
[im 5/99  bone]
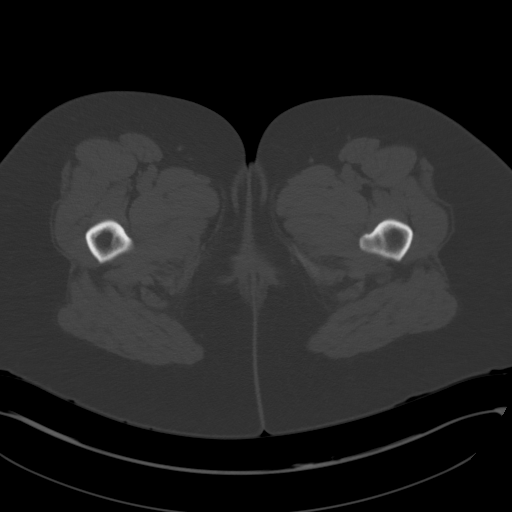
[im 13/99  soft-tissue]
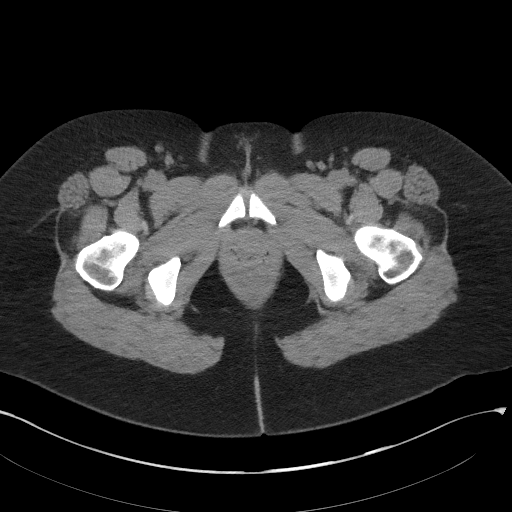
[im 21/99  soft-tissue]
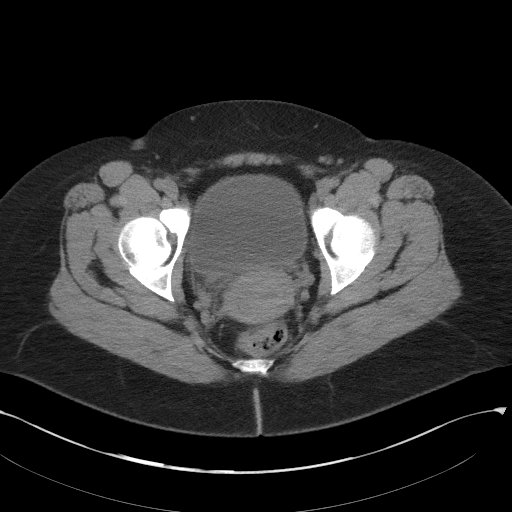
[im 29/99  soft-tissue]
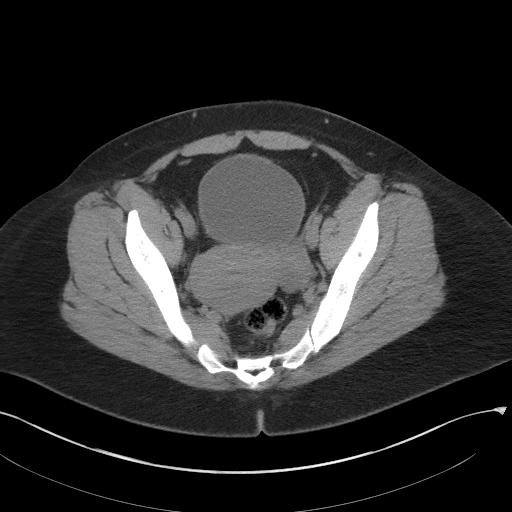
[im 33/99  soft-tissue]
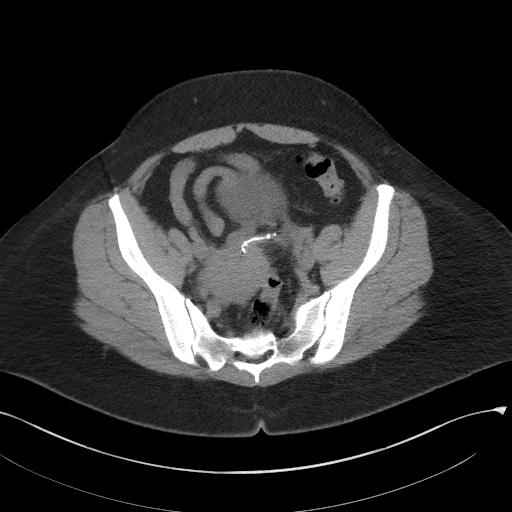
[im 41/99  soft-tissue]
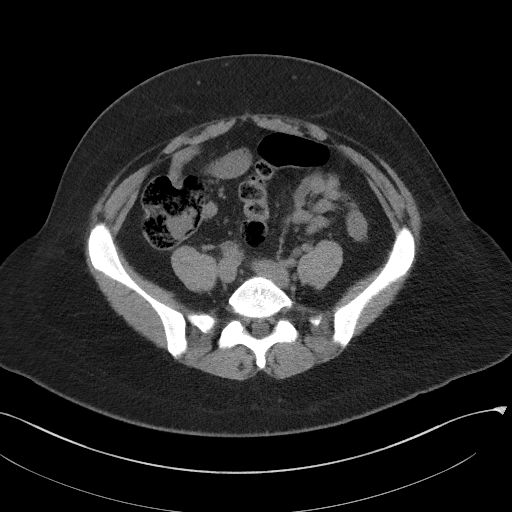
[im 50/99  soft-tissue]
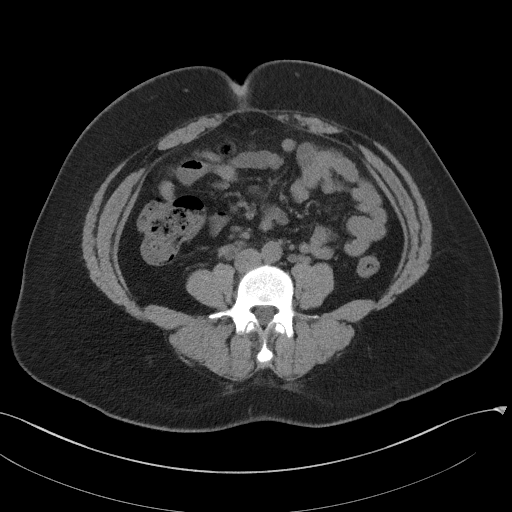
[im 58/99  soft-tissue]
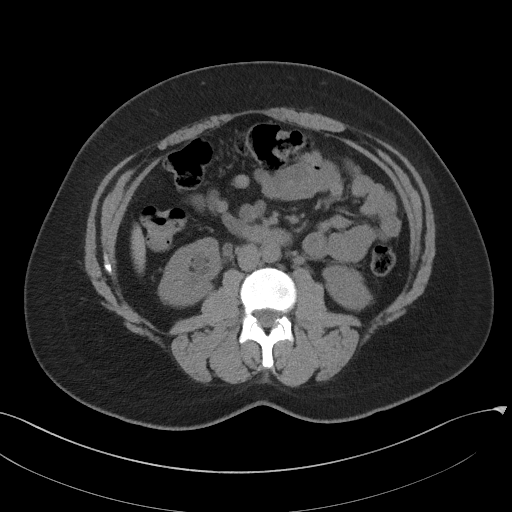
[im 66/99  soft-tissue]
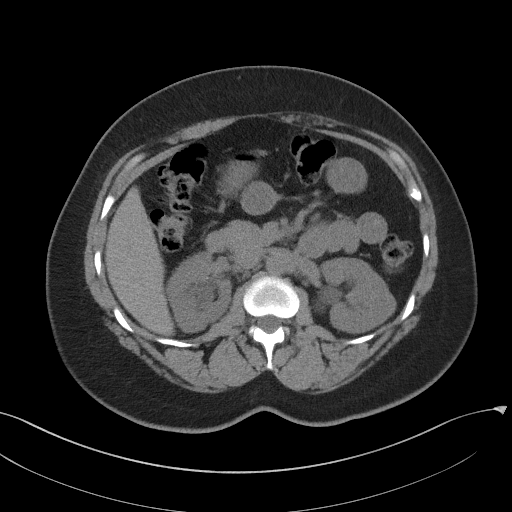
[im 66/99  bone]
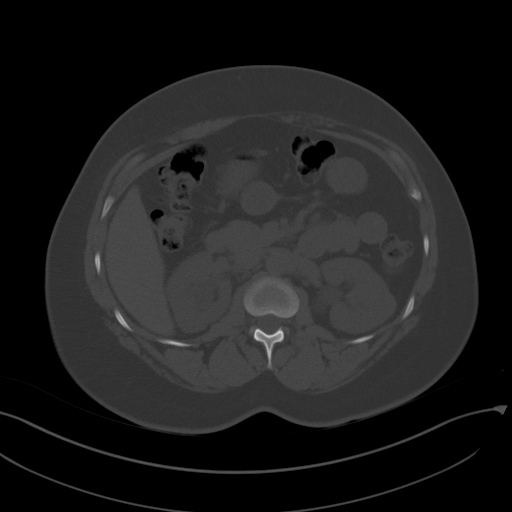
[im 70/99  soft-tissue]
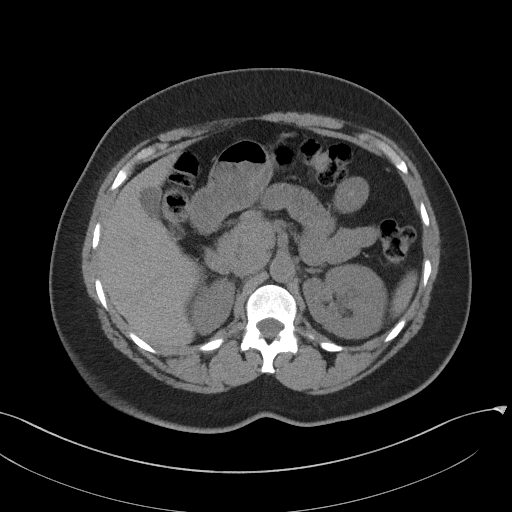
[im 78/99  soft-tissue]
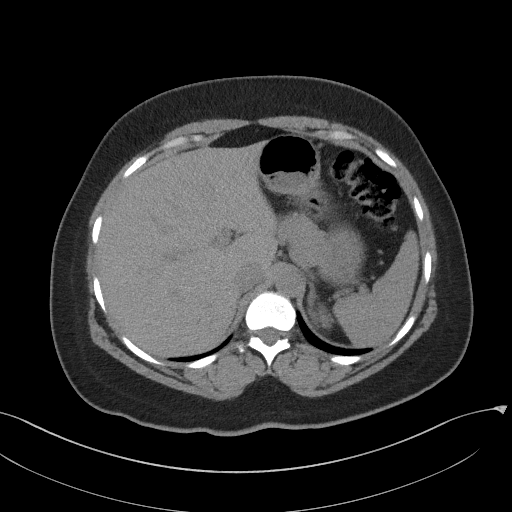
[im 86/99  soft-tissue]
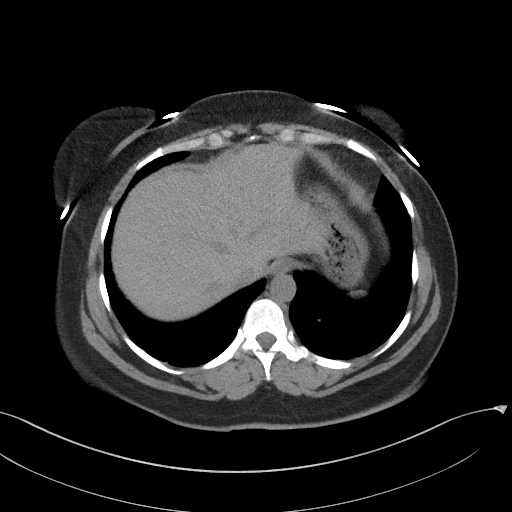
[im 94/99  soft-tissue]
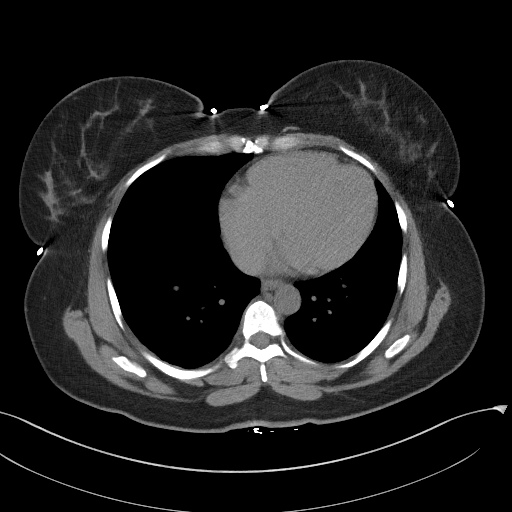

[Series 5: coronal st · coronal · 0.87mm/px · 3 of 101 slices shown]
[im 34/101  soft-tissue]
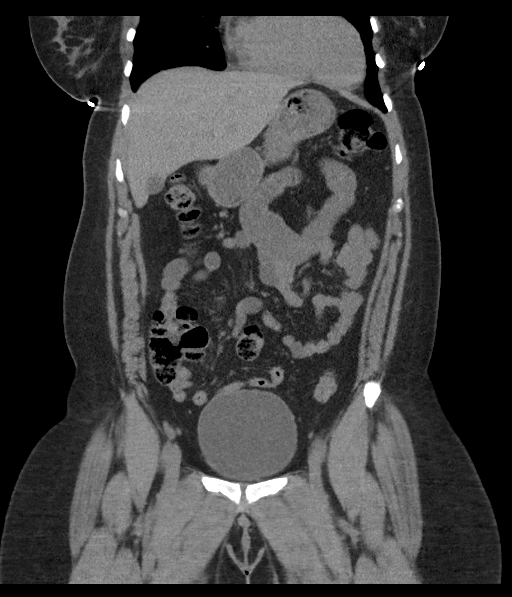
[im 45/101  soft-tissue]
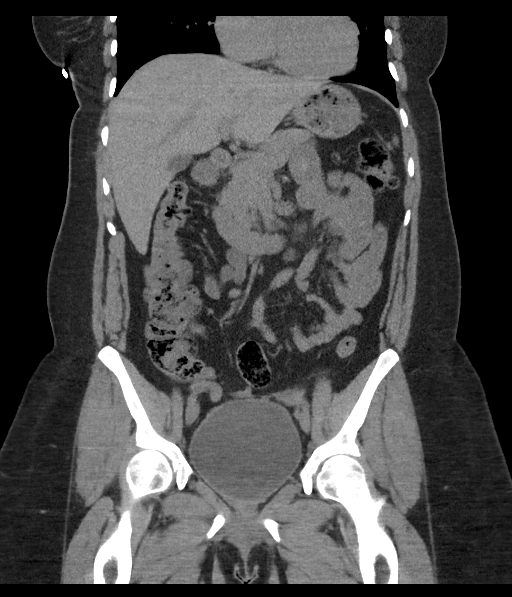
[im 56/101  soft-tissue]
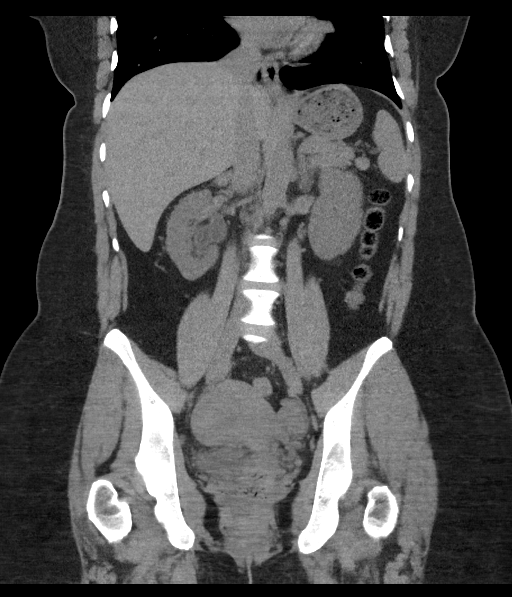

[16 of 46 positions shown; findings below may reference images not displayed]

FINDINGS: Lower chest: No acute abnormality.

Hepatobiliary: No focal liver abnormality is seen. No gallstones,
gallbladder wall thickening, or biliary dilatation.

Pancreas: Unremarkable. No pancreatic ductal dilatation or
surrounding inflammatory changes.

Spleen: Normal in size without focal abnormality.

Adrenals/Urinary Tract: Adrenal glands appear normal. No renal or
ureteral calculi are noted. Mild right hydroureteronephrosis is
noted without obstructing calculus, which may be due to distal
ureteral spasm from recently passed stone. Urinary bladder is mildly
distended.

Stomach/Bowel: Stomach is within normal limits. Appendix appears
normal. No evidence of bowel wall thickening, distention, or
inflammatory changes.

Vascular/Lymphatic: No significant vascular findings are present. No
enlarged abdominal or pelvic lymph nodes.

Reproductive: Uterus is unremarkable. Occlusion colloidal is seen in
the expected position of left fallopian tube. No other adnexal
abnormality is noted.

Other: No abdominal wall hernia or abnormality. No abdominopelvic
ascites.

Musculoskeletal: No acute or significant osseous findings.
IMPRESSION: Mild right hydroureteronephrosis is noted without obstructing
calculus, which may be due to distal right ureteral spasm from
recently passed stone.

No other significant abnormality seen in the abdomen or pelvis.

## 2019-01-02 ENCOUNTER — Other Ambulatory Visit: Payer: Self-pay

## 2019-01-02 ENCOUNTER — Encounter (HOSPITAL_BASED_OUTPATIENT_CLINIC_OR_DEPARTMENT_OTHER): Payer: Self-pay | Admitting: Emergency Medicine

## 2019-01-02 ENCOUNTER — Emergency Department (HOSPITAL_BASED_OUTPATIENT_CLINIC_OR_DEPARTMENT_OTHER)
Admission: EM | Admit: 2019-01-02 | Discharge: 2019-01-02 | Disposition: A | Discharge status: Home / Self Care | Payer: Self-pay | Attending: Emergency Medicine | Admitting: Emergency Medicine

## 2019-01-02 DIAGNOSIS — R590 Localized enlarged lymph nodes: Secondary | ICD-10-CM

## 2019-01-02 DIAGNOSIS — I10 Essential (primary) hypertension: Secondary | ICD-10-CM | POA: Insufficient documentation

## 2019-01-02 DIAGNOSIS — J029 Acute pharyngitis, unspecified: Secondary | ICD-10-CM | POA: Insufficient documentation

## 2019-01-02 DIAGNOSIS — F17210 Nicotine dependence, cigarettes, uncomplicated: Secondary | ICD-10-CM | POA: Insufficient documentation

## 2019-01-02 LAB — GROUP A STREP BY PCR: GROUP A STREP BY PCR: NOT DETECTED

## 2019-01-02 NOTE — ED Provider Notes (Signed)
[facility] EMERGENCY DEPARTMENT Provider Note  CSN: [id number] Arrival date & time: 01/02/19 0548  Chief Complaint(s) Sore Throat  HPI Julie Lyons is a 38 y.o. female who presents to the emergency department with several days of runny nose, cough, congestion and 2 days of sore throat and neck pain.  She denies any known fevers or chills.  She is endorsing lumps and tenderness in the submandibular and cervical regions.  She denies any dental pain.  No shortness of breath.  No headache.  No nuchal rigidity.  No nausea or vomiting.  No abdominal pain.  No chest pain.  She reports that her daughter has had URI symptoms for the past week.  HPI  Past Medical History Past Medical History:  Diagnosis Date  . Asthma    albuterol inhaler-not used for several months  . GERD (gastroesophageal reflux disease)    occas- no meds  . Headache(784.0)   . Hypertension    on hctz  . Ovarian cyst    Patient Active Problem List   Diagnosis Date Noted  . 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 Proteinuria complicating pregnancy 01/09/2012  . Genital herpes 12/26/2011  . Supervision of high-risk pregnancy 12/26/2011  . Cervical incompetence with SAB at 16 weeks (silent dilation and delivery) 11/07/2011  . UTI in pregnancy, antepartum 11/07/2011  . Benign essential hypertension antepartum 11/07/2011  . Asthma 11/07/2011   Home Medication(s) Prior to Admission medications   Medication Sig Start Date End Date Taking? Authorizing Provider  ALBUTEROL IN Inhale 2 puffs into the lungs.   Yes [provider]                                                                                                                                    Past Surgical History Past Surgical History:  Procedure Laterality Date  . CERVICAL CERCLAGE  12/02/2011   Procedure: CERCLAGE CERVICAL;  Surgeon: Tereso Newcomer, MD;  Location: [facility];  Service: Gynecology;  Laterality: N/A;  . CERVICAL CERCLAGE   05/15/2012   Procedure: CERCLAGE CERVICAL;  Surgeon: Adam Phenix, MD;  Location: [facility];  Service: Gynecology;  Laterality: N/A;  removal of cerclage from cervix  . OOPHORECTOMY     left ovary and tube  . TONSILLECTOMY    . TUBAL LIGATION  08/18/2012   Procedure: ESSURE TUBAL STERILIZATION;  Surgeon: Willodean Rosenthal, MD;  Location: [facility];  Service: Gynecology;  Laterality: Left;  Marland Kitchen VAGINAL DELIVERY  1997, 2000. 2004   Family History Family History  Problem Relation Age of Onset  . Hypertension Father   . Anesthesia problems Mother     Social History Social History   Tobacco Use  . Smoking status: Current Every Day Smoker    Packs/day: 0.25    Years: 10.00    Pack years: 2.50    Types: Cigarettes  . Smokeless tobacco: Never Used  Substance Use Topics  .  Alcohol use: No    Comment: occassionally  . Drug use: No   Allergies Patient has no known allergies.  Review of Systems Review of Systems All other systems are reviewed and are negative for acute change except as noted in the HPI  Physical Exam Vital Signs  I have reviewed the triage vital signs BP 139/89 (BP Location: Left Arm)   Pulse 64   Temp 98.4 F (36.9 C) (Oral)   Resp 16   Ht 5\' 7"  (1.702 m)   Wt 95.3 kg   SpO2 99%   BMI 32.89 kg/m   Physical Exam Vitals signs reviewed.  Constitutional:      General: She is not in acute distress.    Appearance: She is well-developed. She is not diaphoretic.  HENT:     Head: Normocephalic and atraumatic.     Nose: Mucosal edema present.     Mouth/Throat:     Dentition: No dental tenderness.     Pharynx: Posterior oropharyngeal erythema present. No oropharyngeal exudate.     Comments: Post nasal drip.  Status post tonsillectomy.  Eyes:     General: No scleral icterus.       Right eye: No discharge.        Left eye: No discharge.     Conjunctiva/sclera: Conjunctivae normal.     Pupils: Pupils are equal, round, and reactive to light.  Neck:      Musculoskeletal: Normal range of motion and neck supple.  Cardiovascular:     Rate and Rhythm: Normal rate and regular rhythm.     Heart sounds: No murmur. No friction rub. No gallop.   Pulmonary:     Effort: Pulmonary effort is normal. No respiratory distress.     Breath sounds: Normal breath sounds. No stridor. No rales.  Abdominal:     General: There is no distension.     Palpations: Abdomen is soft.     Tenderness: There is no abdominal tenderness.  Musculoskeletal:        General: No tenderness.  Lymphadenopathy:     Head:     Right side of head: Submandibular adenopathy present.     Left side of head: Submandibular adenopathy present.     Cervical: Cervical adenopathy present.     Right cervical: Superficial cervical adenopathy present.     Left cervical: Superficial cervical adenopathy present.  Skin:    General: Skin is warm and dry.     Findings: No erythema or rash.  Neurological:     Mental Status: She is alert and oriented to person, place, and time.     ED Results and Treatments Labs (all labs ordered are listed, but only abnormal results are displayed) Labs Reviewed  GROUP A STREP BY PCR                                                                                                                         EKG  EKG Interpretation  Date/Time:  Ventricular Rate:    PR Interval:    QRS Duration:   QT Interval:    QTC Calculation:   R Axis:     Text Interpretation:        Radiology No results found. Pertinent labs & imaging results that were available during my care of the patient were reviewed by me and considered in my medical decision making (see chart for details).  Medications Ordered in ED Medications - No data to display                                                                                                                                  Procedures Procedures  (including critical care time)  Medical Decision Making / ED Course I  have reviewed the nursing notes for this encounter and the patient's prior records (if available in EHR or on provided paperwork).    Rapid strep negative.  Symptoms most consistent with viral pharyngitis with bilateral lymphadenopathy.  No evidence of peritonsillar/dental abscess or Ludwig's.   Supportive management recommended.  The patient appears reasonably screened and/or stabilized for discharge and I doubt any other medical condition or other [facility] requiring further screening, evaluation, or treatment in the ED at this time prior to discharge.  The patient is safe for discharge with strict return precautions.   Final Clinical Impression(s) / ED Diagnoses Final diagnoses:  Viral pharyngitis  LAD (lymphadenopathy), cervical   Disposition: Discharge  Condition: Good  I have discussed the results, Dx and Tx plan with the patient who expressed understanding and agree(s) with the plan. Discharge instructions discussed at great length. The patient was given strict return precautions who verbalized understanding of the instructions. No further questions at time of discharge.    ED Discharge Orders    None       Follow Up: Primary care provider  Schedule an appointment as soon as possible for a visit  in 5-7 days, If symptoms do not improve or  worsen      This chart was dictated using voice recognition software.  Despite best efforts to proofread,  errors can occur which can change the documentation meaning.   Nira Conn, MD 01/02/19 [phone]

## 2019-01-02 NOTE — ED Triage Notes (Signed)
Pt c/o sore throat, body aches, and swelling to neck that started yesterday.

## 2019-01-02 NOTE — Discharge Instructions (Signed)
You may take over-the-counter medicine for symptomatic relief, such as Tylenol, Motrin, TheraFlu, Alka seltzer , black elderberry, etc. Please limit acetaminophen (Tylenol) to 4000 mg and Ibuprofen (Motrin, Advil, etc.) to 2400 mg for a 24hr period. Please note that other over-the-counter medicine may contain acetaminophen or ibuprofen as a component of their ingredients.   

## 2019-01-31 ENCOUNTER — Emergency Department (HOSPITAL_BASED_OUTPATIENT_CLINIC_OR_DEPARTMENT_OTHER): Payer: Self-pay

## 2019-01-31 ENCOUNTER — Other Ambulatory Visit: Payer: Self-pay

## 2019-01-31 ENCOUNTER — Emergency Department (HOSPITAL_BASED_OUTPATIENT_CLINIC_OR_DEPARTMENT_OTHER)
Admission: EM | Admit: 2019-01-31 | Discharge: 2019-01-31 | Disposition: A | Discharge status: Home / Self Care | Payer: Self-pay | Attending: Emergency Medicine | Admitting: Emergency Medicine

## 2019-01-31 ENCOUNTER — Encounter (HOSPITAL_BASED_OUTPATIENT_CLINIC_OR_DEPARTMENT_OTHER): Payer: Self-pay | Admitting: Emergency Medicine

## 2019-01-31 DIAGNOSIS — F17210 Nicotine dependence, cigarettes, uncomplicated: Secondary | ICD-10-CM | POA: Insufficient documentation

## 2019-01-31 DIAGNOSIS — Y9301 Activity, walking, marching and hiking: Secondary | ICD-10-CM | POA: Insufficient documentation

## 2019-01-31 DIAGNOSIS — S42351A Displaced comminuted fracture of shaft of humerus, right arm, initial encounter for closed fracture: Secondary | ICD-10-CM | POA: Insufficient documentation

## 2019-01-31 DIAGNOSIS — W0110XA Fall on same level from slipping, tripping and stumbling with subsequent striking against unspecified object, initial encounter: Secondary | ICD-10-CM | POA: Insufficient documentation

## 2019-01-31 DIAGNOSIS — Y999 Unspecified external cause status: Secondary | ICD-10-CM | POA: Insufficient documentation

## 2019-01-31 DIAGNOSIS — J45909 Unspecified asthma, uncomplicated: Secondary | ICD-10-CM | POA: Insufficient documentation

## 2019-01-31 DIAGNOSIS — Y929 Unspecified place or not applicable: Secondary | ICD-10-CM | POA: Insufficient documentation

## 2019-01-31 DIAGNOSIS — I10 Essential (primary) hypertension: Secondary | ICD-10-CM | POA: Insufficient documentation

## 2019-01-31 DIAGNOSIS — S42309A Unspecified fracture of shaft of humerus, unspecified arm, initial encounter for closed fracture: Secondary | ICD-10-CM | POA: Insufficient documentation

## 2019-01-31 MED ORDER — OXYCODONE HCL 5 MG PO TABS: 5.0000 mg | ORAL_TABLET | Freq: Four times a day (QID) | ORAL | 0 refills | Status: AC | PRN

## 2019-01-31 MED ORDER — HYDROMORPHONE HCL 1 MG/ML IJ SOLN
1.0000 mg | Freq: Once | INTRAMUSCULAR | Status: AC
  Administered 2019-01-31: 1 mg via INTRAMUSCULAR
  Filled 2019-01-31: qty 1

## 2019-01-31 MED ORDER — CYCLOBENZAPRINE HCL 10 MG PO TABS: 5.0000 mg | ORAL_TABLET | Freq: Three times a day (TID) | ORAL | 0 refills | Status: AC | PRN

## 2019-01-31 MED ORDER — HYDROMORPHONE HCL 1 MG/ML IJ SOLN
0.5000 mg | Freq: Once | INTRAMUSCULAR | Status: AC
  Administered 2019-01-31: 0.5 mg via INTRAMUSCULAR
  Filled 2019-01-31: qty 1

## 2019-01-31 MED ORDER — ACETAMINOPHEN 500 MG PO TABS: 1000.0000 mg | ORAL_TABLET | Freq: Three times a day (TID) | ORAL | 0 refills | Status: AC

## 2019-01-31 NOTE — ED Triage Notes (Signed)
Pt admits to 3-4 beers tonight. She is crying during triage process asking for something for her shoulder

## 2019-01-31 NOTE — ED Notes (Signed)
PMS intact before and after. Pt tolerated well. All questions answered. 

## 2019-01-31 NOTE — ED Notes (Signed)
Patient transported to X-ray 

## 2019-01-31 NOTE — ED Provider Notes (Addendum)
[facility] EMERGENCY DEPARTMENT Provider Note  CSN: [id number] Arrival date & time: 01/31/19 0150  Chief Complaint(s) Shoulder Pain  HPI Julie Lyons is a 38 y.o. female who presents to the emergency department with right shoulder pain following a mechanical fall while intoxicated.  She was brought in by the husband who reports that the patient apparently was chasing a friend and while running, she slipped and try to catch herself on a door frame.  They deny any loss of consciousness.  No head trauma.  Patient denies any neck pain, back pain, chest pain, abdominal pain, hip pain, lower extremity pain.  Right shoulder pain is severe, throbbing in nature.  Exacerbated with movement and palpation.  No alleviating factors.  HPI  Past Medical History Past Medical History:  Diagnosis Date  . Asthma    albuterol inhaler-not used for several months  . GERD (gastroesophageal reflux disease)    occas- no meds  . Headache(784.0)   . Hypertension    on hctz  . Ovarian cyst    Patient Active Problem List   Diagnosis Date Noted  . 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 Proteinuria complicating pregnancy 01/09/2012  . Genital herpes 12/26/2011  . Supervision of high-risk pregnancy 12/26/2011  . Cervical incompetence with SAB at 16 weeks (silent dilation and delivery) 11/07/2011  . UTI in pregnancy, antepartum 11/07/2011  . Benign essential hypertension antepartum 11/07/2011  . Asthma 11/07/2011   Home Medication(s) Prior to Admission medications   Medication Sig Start Date End Date Taking? Authorizing Provider  acetaminophen (TYLENOL) 500 MG tablet Take 2 tablets (1,000 mg total) by mouth every 8 (eight) hours for 5 days. Do not take more than 4000 mg of acetaminophen (Tylenol) in a 24-hour period. Please note that other medicines that you may be prescribed may have Tylenol as well. 01/31/19 02/05/19  Nira Connardama, Kwabena Strutz Eduardo, MD  ALBUTEROL IN Inhale 2 puffs into the lungs.    [provider]  cyclobenzaprine (FLEXERIL) 10 MG tablet Take 0.5-1 tablets (5-10 mg total) by mouth 3 (three) times daily as needed for up to 10 days for muscle spasms. 01/31/19 02/10/19  Nira Connardama, Mariah Harn Eduardo, MD  oxyCODONE (ROXICODONE) 5 MG immediate release tablet Take 1 tablet (5 mg total) by mouth every 6 (six) hours as needed for up to 5 days for severe pain. 01/31/19 02/05/19  Nira Connardama, Mariko Nowakowski Eduardo, MD                                                                                                                                    Past Surgical History Past Surgical History:  Procedure Laterality Date  . CERVICAL CERCLAGE  12/02/2011   Procedure: CERCLAGE CERVICAL;  Surgeon: Tereso NewcomerUgonna A Anyanwu, MD;  Location: [facility];  Service: Gynecology;  Laterality: N/A;  . CERVICAL CERCLAGE  05/15/2012   Procedure: CERCLAGE CERVICAL;  Surgeon: Adam PhenixJames G Arnold, MD;  Location: [facility];  Service: Gynecology;  Laterality: N/A;  removal of cerclage from cervix  . OOPHORECTOMY     left ovary and tube  . TONSILLECTOMY    . TUBAL LIGATION  08/18/2012   Procedure: ESSURE TUBAL STERILIZATION;  Surgeon: Willodean Rosenthal, MD;  Location: [facility];  Service: Gynecology;  Laterality: Left;  Marland Kitchen VAGINAL DELIVERY  1997, 2000. 2004   Family History Family History  Problem Relation Age of Onset  . Hypertension Father   . Anesthesia problems Mother     Social History Social History   Tobacco Use  . Smoking status: Current Every Day Smoker    Packs/day: 0.25    Years: 10.00    Pack years: 2.50    Types: Cigarettes  . Smokeless tobacco: Never Used  Substance Use Topics  . Alcohol use: No    Comment: occassionally  . Drug use: No   Allergies Patient has no known allergies.  Review of Systems Review of Systems All other systems are reviewed and are negative for acute change except as noted in the HPI  Physical Exam Vital Signs  I have reviewed the triage vital signs BP 113/71   Pulse 95   Temp 97.8 F  (36.6 C) (Oral)   Resp (!) 22   Ht 5\' 7"  (1.702 m)   Wt 95.3 kg   LMP 01/29/2019   SpO2 94%   BMI 32.89 kg/m   Physical Exam Constitutional:      General: She is not in acute distress.    Appearance: She is well-developed. She is not diaphoretic.  HENT:     Head: Normocephalic and atraumatic.     Right Ear: External ear normal.     Left Ear: External ear normal.     Nose: Nose normal.  Eyes:     General: No scleral icterus.       Right eye: No discharge.        Left eye: No discharge.     Conjunctiva/sclera: Conjunctivae normal.     Pupils: Pupils are equal, round, and reactive to light.  Neck:     Musculoskeletal: Normal range of motion and neck supple.  Cardiovascular:     Rate and Rhythm: Normal rate and regular rhythm.     Pulses:          Radial pulses are 2+ on the right side and 2+ on the left side.       Dorsalis pedis pulses are 2+ on the right side and 2+ on the left side.     Heart sounds: Normal heart sounds. No murmur. No friction rub. No gallop.   Pulmonary:     Effort: Pulmonary effort is normal. No respiratory distress.     Breath sounds: Normal breath sounds. No stridor. No wheezing.  Abdominal:     General: There is no distension.     Palpations: Abdomen is soft.     Tenderness: There is no abdominal tenderness.  Musculoskeletal:        General: No tenderness.     Cervical back: She exhibits no bony tenderness.     Thoracic back: She exhibits no bony tenderness.     Lumbar back: She exhibits no bony tenderness.     Right upper arm: She exhibits bony tenderness, swelling and deformity. She exhibits no laceration.     Right hand: Normal sensation noted. Decreased sensation is not present in the ulnar distribution, is not present in the medial distribution and is not present in the radial distribution. Normal strength noted.  Comments: Clavicles stable. Chest stable to AP/Lat compression. Pelvis stable to Lat compression. No chest or abdominal wall  contusion.  Skin:    General: Skin is warm and dry.     Findings: No erythema or rash.  Neurological:     Mental Status: She is alert and oriented to person, place, and time.     ED Results and Treatments Labs (all labs ordered are listed, but only abnormal results are displayed) Labs Reviewed - No data to display                                                                                                                       EKG  EKG Interpretation  Date/Time:    Ventricular Rate:    PR Interval:    QRS Duration:   QT Interval:    QTC Calculation:   R Axis:     Text Interpretation:        Radiology Dg Shoulder Right  Result Date: 01/31/2019 CLINICAL DATA:  Right upper arm pain and deformity after a fall. EXAM: RIGHT SHOULDER - 2+ VIEW; RIGHT ELBOW - COMPLETE 3+ VIEW COMPARISON:  None. FINDINGS: Three views of the right humerus and three views of the right elbow are obtained. Comminuted spiral oblique fractures of the midshaft right humerus extending into the distal shaft. Medial angulation of the distal fracture fragments. No dislocation at the glenohumeral joint. Soft tissues are unremarkable. Right elbow demonstrates no additional fracture or dislocation. Nonstandard positioning on the lateral for occludes evaluation for effusion. IMPRESSION: Acute posttraumatic comminuted fractures of the midshaft right humerus. No additional fracture seen in the right elbow. Electronically Signed   By: Burman Nieves M.D.   On: 01/31/2019 03:34   Dg Elbow Complete Right  Result Date: 01/31/2019 CLINICAL DATA:  Right upper arm pain and deformity after a fall. EXAM: RIGHT SHOULDER - 2+ VIEW; RIGHT ELBOW - COMPLETE 3+ VIEW COMPARISON:  None. FINDINGS: Three views of the right humerus and three views of the right elbow are obtained. Comminuted spiral oblique fractures of the midshaft right humerus extending into the distal shaft. Medial angulation of the distal fracture fragments. No  dislocation at the glenohumeral joint. Soft tissues are unremarkable. Right elbow demonstrates no additional fracture or dislocation. Nonstandard positioning on the lateral for occludes evaluation for effusion. IMPRESSION: Acute posttraumatic comminuted fractures of the midshaft right humerus. No additional fracture seen in the right elbow. Electronically Signed   By: Burman Nieves M.D.   On: 01/31/2019 03:34   Dg Humerus Right  Result Date: 01/31/2019 CLINICAL DATA:  Right humerus fracture status post reduction EXAM: RIGHT HUMERUS - 2+ VIEW COMPARISON:  None. FINDINGS: Comminuted right mid humeral shaft fracture, now in near anatomic alignment, with overlying splint. IMPRESSION: Comminuted right mid humeral shaft fracture, now in near anatomic alignment. Electronically Signed   By: Charline Bills M.D.   On: 01/31/2019 05:30   Pertinent labs & imaging results that were available during my care of the  patient were reviewed by me and considered in my medical decision making (see chart for details).  Medications Ordered in ED Medications  HYDROmorphone (DILAUDID) injection 0.5 mg (0.5 mg Intramuscular Given 01/31/19 0237)  HYDROmorphone (DILAUDID) injection 1 mg (1 mg Intramuscular Given 01/31/19 0335)                                                                                                                                    Procedures .Ortho Injury Treatment Date/Time: 01/31/2019 5:35 AM Performed by: Nira Connardama, Aleena Kirkeby Eduardo, MD Authorized by: Nira Connardama, Abigail Marsiglia Eduardo, MD   Consent:    Consent obtained:  Verbal   Consent given by:  Patient and spouse   Risks discussed:  Fracture, nerve damage, irreducible dislocation and recurrent dislocation   Alternatives discussed:  Immobilization and alternative treatmentInjury location: upper arm Location details: right upper arm Injury type: fracture-dislocation Pre-procedure neurovascular assessment: neurovascularly intact  Anesthesia: Local  anesthesia used: no  Patient sedated: NoManipulation performed: yes Skeletal traction used: yes Reduction successful: improved alignment. X-ray confirmed reduction: yes Immobilization: splint Splint type: coaptation with long arm posterior splint. Supplies used: Ortho-Glass Post-procedure neurovascular assessment: post-procedure neurovascularly intact Patient tolerance: Patient tolerated the procedure well with no immediate complications     (including critical care time)  Medical Decision Making / ED Course I have reviewed the nursing notes for this encounter and the patient's prior records (if available in EHR or on provided paperwork).    Mechanical fall resulting in right midshaft humeral fracture.  Patient is neurovascularly intact.  No other injuries noted on exam requiring imaging.  Traction was applied to the extremity for realignment.  Splinted in combined coaptation/long-arm posterior splint.  Sling provided.  Improved alignment noted on repeat imaging.  Will need to follow-up with orthopedic surgery in 3 to 5 days to be evaluated for surgery.  Final Clinical Impression(s) / ED Diagnoses Final diagnoses:  Humeral fracture  Closed displaced comminuted fracture of shaft of right humerus, initial encounter    Disposition: Discharge  Condition: Good  I have discussed the results, Dx and Tx plan with the patient and husband who expressed understanding and agree(s) with the plan. Discharge instructions discussed at great length. The patient and husband was given strict return precautions who verbalized understanding of the instructions. No further questions at time of discharge.    ED Discharge Orders         Ordered    acetaminophen (TYLENOL) 500 MG tablet  Every 8 hours     01/31/19 0541    oxyCODONE (ROXICODONE) 5 MG immediate release tablet  Every 6 hours PRN     01/31/19 0541    cyclobenzaprine (FLEXERIL) 10 MG tablet  3 times daily PRN     01/31/19 0541           Narcotic database reviewed and no active narcotic prescriptions noted.  Follow Up: Eldred MangesYates, Mark C, MD [address] [place]  [address] [place] [address] [phone]  Schedule an appointment as soon as possible for a visit  in 3-5 days, For close follow up to assess for humerus fracture     This chart was dictated using voice recognition software.  Despite best efforts to proofread,  errors can occur which can change the documentation meaning.     Nira Conn, MD 01/31/19 [phone]

## 2019-01-31 NOTE — ED Triage Notes (Signed)
Pt states that she fell at a friends house. Larey SeatFell forward and right arm hit a door jam and now complains of shoulder pain. Keeps saying "my shoulder is out".

## 2019-02-02 ENCOUNTER — Encounter (INDEPENDENT_AMBULATORY_CARE_PROVIDER_SITE_OTHER): Payer: Self-pay | Admitting: Orthopaedic Surgery

## 2019-02-02 ENCOUNTER — Ambulatory Visit (INDEPENDENT_AMBULATORY_CARE_PROVIDER_SITE_OTHER): Payer: Self-pay

## 2019-02-02 ENCOUNTER — Encounter (HOSPITAL_BASED_OUTPATIENT_CLINIC_OR_DEPARTMENT_OTHER): Payer: Self-pay | Admitting: *Deleted

## 2019-02-02 ENCOUNTER — Ambulatory Visit (INDEPENDENT_AMBULATORY_CARE_PROVIDER_SITE_OTHER): Payer: Self-pay | Admitting: Orthopaedic Surgery

## 2019-02-02 ENCOUNTER — Other Ambulatory Visit: Payer: Self-pay

## 2019-02-02 VITALS — BP 149/103 | HR 90 | Ht 67.0 in | Wt 213.0 lb

## 2019-02-02 DIAGNOSIS — W010XXA Fall on same level from slipping, tripping and stumbling without subsequent striking against object, initial encounter: Secondary | ICD-10-CM

## 2019-02-02 DIAGNOSIS — S42351A Displaced comminuted fracture of shaft of humerus, right arm, initial encounter for closed fracture: Secondary | ICD-10-CM

## 2019-02-02 NOTE — Progress Notes (Signed)
Office Visit Note   Patient: Julie Lyons           Date of Birth: 01/09/1981           MRN: [id number] Visit Date: 02/02/2019              Requested by: No referring provider defined for this encounter. PCP: Patient, No Pcp Per   Assessment & Plan: Visit Diagnoses:  1. Closed displaced comminuted fracture of shaft of right humerus, initial encounter     Plan: Impression is displaced and angulated comminuted right humeral shaft fracture.  These x-rays were reviewed with the patient and her husband and recommendations for surgical stabilization and restoration of alignment for proper healing and eventual function of her right upper extremity.  Risks and benefits and rehab and recovery were discussed with the patient.  Questions encouraged and answered.  We will plan on surgery later this week.  Follow-Up Instructions: Return for 1 week postop visit.   Orders:  Orders Placed This Encounter  Procedures  . XR Humerus Right   No orders of the defined types were placed in this encounter.     Procedures: No procedures performed   Clinical Data: No additional findings.   Subjective: Chief Complaint  Patient presents with  . Right Upper Arm - Fracture    DOI 01/31/2019    Julie BraunKaren is a 38 year old right-hand-dominant female who sustained a displaced right humeral shaft fracture 2 days ago when she slipped on wet ground and landed directly on her right arm.  She was evaluated in the ER and x-rays revealed a comminuted displaced and angulated humeral shaft fracture.  She was placed in a coaptation splint and she follows up today.  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  Denies any numbness and tingling.  She is taking oxycodone for the pain.   Review of Systems  Constitutional: Negative.   HENT: Negative.   Eyes: Negative.   Respiratory: Negative.   Cardiovascular: Negative.   Endocrine: Negative.   Musculoskeletal: Negative.   Neurological: Negative.     Hematological: Negative.   Psychiatric/Behavioral: Negative.   All other systems reviewed and are negative.    Objective: Vital Signs: BP (!) 149/103   Pulse 90   Ht 5\' 7"  (1.702 m)   Wt 213 lb (96.6 kg)   LMP 01/29/2019   BMI 33.36 kg/m   Physical Exam Vitals signs and nursing note reviewed.  Constitutional:      Appearance: She is well-developed.  HENT:     Head: Normocephalic and atraumatic.  Neck:     Musculoskeletal: Neck supple.  Pulmonary:     Effort: Pulmonary effort is normal.  Abdominal:     Palpations: Abdomen is soft.  Skin:    General: Skin is warm.     Capillary Refill: Capillary refill takes less than 2 seconds.  Neurological:     Mental Status: She is alert and oriented to person, place, and time.  Psychiatric:        Behavior: Behavior normal.        Thought Content: Thought content normal.        Judgment: Judgment normal.     Ortho Exam Right upper extremity exam shows a well fitting splint.  She has moderate swelling.  No neurovascular compromise.  Radial nerve is intact. Specialty Comments:  No specialty comments available.  Imaging: No results found.   PMFS History: Patient Active Problem List   Diagnosis Date Noted  .  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 Proteinuria complicating pregnancy 01/09/2012  . Genital herpes 12/26/2011  . Supervision of high-risk pregnancy 12/26/2011  . Cervical incompetence with SAB at 16 weeks (silent dilation and delivery) 11/07/2011  . UTI in pregnancy, antepartum 11/07/2011  . Benign essential hypertension antepartum 11/07/2011  . Asthma 11/07/2011   Past Medical History:  Diagnosis Date  . Asthma    albuterol inhaler-not used for several months  . GERD (gastroesophageal reflux disease)    occas- no meds  . Headache(784.0)   . Hypertension    on hctz  . Ovarian cyst     Family History  Problem Relation Age of Onset  . Hypertension Father   . Anesthesia problems Mother     Past Surgical History:   Procedure Laterality Date  . CERVICAL CERCLAGE  12/02/2011   Procedure: CERCLAGE CERVICAL;  Surgeon: Tereso Newcomer, MD;  Location: [facility];  Service: Gynecology;  Laterality: N/A;  . CERVICAL CERCLAGE  05/15/2012   Procedure: CERCLAGE CERVICAL;  Surgeon: Adam Phenix, MD;  Location: [facility];  Service: Gynecology;  Laterality: N/A;  removal of cerclage from cervix  . OOPHORECTOMY     left ovary and tube  . TONSILLECTOMY    . TUBAL LIGATION  08/18/2012   Procedure: ESSURE TUBAL STERILIZATION;  Surgeon: Willodean Rosenthal, MD;  Location: [facility];  Service: Gynecology;  Laterality: Left;  Marland Kitchen VAGINAL DELIVERY  1997, 2000. 2004   Social History   Occupational History  . Not on file  Tobacco Use  . Smoking status: Current Every Day Smoker    Packs/day: 0.25    Years: 10.00    Pack years: 2.50    Types: Cigarettes  . Smokeless tobacco: Never Used  Substance and Sexual Activity  . Alcohol use: No    Comment: occassionally  . Drug use: No  . Sexual activity: Yes    Birth control/protection: None

## 2019-02-04 MED ORDER — TRANEXAMIC ACID-NACL 1000-0.7 MG/100ML-% IV SOLN: 1000.0000 mg | INTRAVENOUS | Status: AC

## 2019-02-04 MED ORDER — TRANEXAMIC ACID 1000 MG/10ML IV SOLN: 2000.0000 mg | INTRAVENOUS | Status: AC

## 2019-02-05 ENCOUNTER — Ambulatory Visit (HOSPITAL_BASED_OUTPATIENT_CLINIC_OR_DEPARTMENT_OTHER): Payer: Self-pay | Admitting: Anesthesiology

## 2019-02-05 ENCOUNTER — Ambulatory Visit (HOSPITAL_COMMUNITY): Payer: Self-pay

## 2019-02-05 ENCOUNTER — Encounter (HOSPITAL_BASED_OUTPATIENT_CLINIC_OR_DEPARTMENT_OTHER): Payer: Self-pay | Admitting: *Deleted

## 2019-02-05 ENCOUNTER — Encounter (HOSPITAL_BASED_OUTPATIENT_CLINIC_OR_DEPARTMENT_OTHER)
Admission: RE | Disposition: A | Discharge status: Home / Self Care | Payer: Self-pay | Source: Home / Self Care | Attending: Orthopaedic Surgery

## 2019-02-05 ENCOUNTER — Ambulatory Visit (HOSPITAL_BASED_OUTPATIENT_CLINIC_OR_DEPARTMENT_OTHER)
Admission: RE | Admit: 2019-02-05 | Discharge: 2019-02-05 | Disposition: A | Discharge status: Home / Self Care | Payer: Self-pay | Attending: Orthopaedic Surgery | Admitting: Orthopaedic Surgery

## 2019-02-05 ENCOUNTER — Other Ambulatory Visit: Payer: Self-pay

## 2019-02-05 DIAGNOSIS — S42351A Displaced comminuted fracture of shaft of humerus, right arm, initial encounter for closed fracture: Secondary | ICD-10-CM

## 2019-02-05 DIAGNOSIS — S42301A Unspecified fracture of shaft of humerus, right arm, initial encounter for closed fracture: Secondary | ICD-10-CM | POA: Insufficient documentation

## 2019-02-05 DIAGNOSIS — X58XXXA Exposure to other specified factors, initial encounter: Secondary | ICD-10-CM | POA: Insufficient documentation

## 2019-02-05 DIAGNOSIS — Z419 Encounter for procedure for purposes other than remedying health state, unspecified: Secondary | ICD-10-CM

## 2019-02-05 DIAGNOSIS — F17210 Nicotine dependence, cigarettes, uncomplicated: Secondary | ICD-10-CM | POA: Insufficient documentation

## 2019-02-05 DIAGNOSIS — J45909 Unspecified asthma, uncomplicated: Secondary | ICD-10-CM | POA: Insufficient documentation

## 2019-02-05 DIAGNOSIS — I10 Essential (primary) hypertension: Secondary | ICD-10-CM | POA: Insufficient documentation

## 2019-02-05 DIAGNOSIS — Z79899 Other long term (current) drug therapy: Secondary | ICD-10-CM | POA: Insufficient documentation

## 2019-02-05 HISTORY — PX: ORIF HUMERUS FRACTURE: SHX2126

## 2019-02-05 HISTORY — DX: Personal history of urinary calculi: Z87.442

## 2019-02-05 HISTORY — DX: Displaced comminuted fracture of shaft of humerus, right arm, initial encounter for closed fracture: S42.351A

## 2019-02-05 LAB — POCT PREGNANCY, URINE: PREG TEST UR: NEGATIVE

## 2019-02-05 SURGERY — OPEN REDUCTION INTERNAL FIXATION (ORIF) HUMERAL SHAFT FRACTURE
Anesthesia: Regional | Site: Arm Upper | Laterality: Right

## 2019-02-05 MED ORDER — FENTANYL CITRATE (PF) 100 MCG/2ML IJ SOLN
INTRAMUSCULAR | Status: AC
  Filled 2019-02-05: qty 2

## 2019-02-05 MED ORDER — TRANEXAMIC ACID 1000 MG/10ML IV SOLN: INTRAVENOUS | Status: DC | PRN

## 2019-02-05 MED ORDER — LACTATED RINGERS IV SOLN
INTRAVENOUS | Status: DC
  Administered 2019-02-05: 12:00:00 via INTRAVENOUS

## 2019-02-05 MED ORDER — MIDAZOLAM HCL 2 MG/2ML IJ SOLN
INTRAMUSCULAR | Status: AC
  Filled 2019-02-05: qty 2

## 2019-02-05 MED ORDER — CHLORHEXIDINE GLUCONATE 4 % EX LIQD: 60.0000 mL | Freq: Once | CUTANEOUS | Status: DC

## 2019-02-05 MED ORDER — PROPOFOL 10 MG/ML IV BOLUS
INTRAVENOUS | Status: AC
  Filled 2019-02-05: qty 20

## 2019-02-05 MED ORDER — CEFAZOLIN SODIUM-DEXTROSE 2-4 GM/100ML-% IV SOLN
2.0000 g | INTRAVENOUS | Status: AC
  Administered 2019-02-05: 2 g via INTRAVENOUS

## 2019-02-05 MED ORDER — ONDANSETRON HCL 4 MG/2ML IJ SOLN: 4.0000 mg | Freq: Once | INTRAMUSCULAR | Status: DC | PRN

## 2019-02-05 MED ORDER — BUPIVACAINE HCL (PF) 0.5 % IJ SOLN
INTRAMUSCULAR | Status: AC
  Filled 2019-02-05: qty 30

## 2019-02-05 MED ORDER — MIDAZOLAM HCL 2 MG/2ML IJ SOLN
1.0000 mg | INTRAMUSCULAR | Status: DC | PRN
  Administered 2019-02-05 (×2): 2 mg via INTRAVENOUS

## 2019-02-05 MED ORDER — DEXAMETHASONE SODIUM PHOSPHATE 10 MG/ML IJ SOLN
INTRAMUSCULAR | Status: AC
  Filled 2019-02-05: qty 1

## 2019-02-05 MED ORDER — PROMETHAZINE HCL 25 MG PO TABS: 25.0000 mg | ORAL_TABLET | Freq: Four times a day (QID) | ORAL | 1 refills | Status: DC | PRN

## 2019-02-05 MED ORDER — PROPOFOL 500 MG/50ML IV EMUL
INTRAVENOUS | Status: DC | PRN
  Administered 2019-02-05: 100 ug/kg/min via INTRAVENOUS

## 2019-02-05 MED ORDER — CALCIUM CARBONATE-VITAMIN D 500-200 MG-UNIT PO TABS: 1.0000 | ORAL_TABLET | Freq: Three times a day (TID) | ORAL | 12 refills | Status: DC

## 2019-02-05 MED ORDER — ZINC SULFATE 220 (50 ZN) MG PO CAPS: 220.0000 mg | ORAL_CAPSULE | Freq: Every day | ORAL | 0 refills | Status: DC

## 2019-02-05 MED ORDER — LACTATED RINGERS IV SOLN
INTRAVENOUS | Status: DC
  Administered 2019-02-05 (×2): via INTRAVENOUS

## 2019-02-05 MED ORDER — FENTANYL CITRATE (PF) 100 MCG/2ML IJ SOLN
50.0000 ug | INTRAMUSCULAR | Status: DC | PRN
  Administered 2019-02-05: 100 ug via INTRAVENOUS

## 2019-02-05 MED ORDER — ONDANSETRON HCL 4 MG/2ML IJ SOLN
INTRAMUSCULAR | Status: AC
  Filled 2019-02-05: qty 2

## 2019-02-05 MED ORDER — ACETAMINOPHEN 10 MG/ML IV SOLN: 1000.0000 mg | Freq: Once | INTRAVENOUS | Status: DC | PRN

## 2019-02-05 MED ORDER — KETOROLAC TROMETHAMINE 30 MG/ML IJ SOLN
INTRAMUSCULAR | Status: AC
  Filled 2019-02-05: qty 1

## 2019-02-05 MED ORDER — PROPOFOL 10 MG/ML IV BOLUS
INTRAVENOUS | Status: DC | PRN
  Administered 2019-02-05 (×2): 100 mg via INTRAVENOUS

## 2019-02-05 MED ORDER — DEXAMETHASONE SODIUM PHOSPHATE 10 MG/ML IJ SOLN
INTRAMUSCULAR | Status: DC | PRN
  Administered 2019-02-05: 8 mg via INTRAVENOUS

## 2019-02-05 MED ORDER — FENTANYL CITRATE (PF) 100 MCG/2ML IJ SOLN
INTRAMUSCULAR | Status: DC | PRN
  Administered 2019-02-05 (×6): 50 ug via INTRAVENOUS

## 2019-02-05 MED ORDER — HYDROMORPHONE HCL 1 MG/ML IJ SOLN: 0.2500 mg | INTRAMUSCULAR | Status: DC | PRN

## 2019-02-05 MED ORDER — ALBUTEROL SULFATE (2.5 MG/3ML) 0.083% IN NEBU
2.5000 mg | INHALATION_SOLUTION | Freq: Once | RESPIRATORY_TRACT | Status: AC | PRN
  Administered 2019-02-05: 2.5 mg via RESPIRATORY_TRACT

## 2019-02-05 MED ORDER — ACETAMINOPHEN 10 MG/ML IV SOLN
INTRAVENOUS | Status: AC
  Filled 2019-02-05: qty 100

## 2019-02-05 MED ORDER — MEPERIDINE HCL 25 MG/ML IJ SOLN: 6.2500 mg | INTRAMUSCULAR | Status: DC | PRN

## 2019-02-05 MED ORDER — TRANEXAMIC ACID-NACL 1000-0.7 MG/100ML-% IV SOLN
INTRAVENOUS | Status: DC | PRN
  Administered 2019-02-05: 1000 mg via INTRAVENOUS

## 2019-02-05 MED ORDER — ONDANSETRON HCL 4 MG PO TABS: 4.0000 mg | ORAL_TABLET | Freq: Three times a day (TID) | ORAL | 0 refills | Status: DC | PRN

## 2019-02-05 MED ORDER — OXYCODONE-ACETAMINOPHEN 5-325 MG PO TABS: 1.0000 | ORAL_TABLET | ORAL | 0 refills | Status: DC | PRN

## 2019-02-05 MED ORDER — BUPIVACAINE-EPINEPHRINE (PF) 0.5% -1:200000 IJ SOLN
INTRAMUSCULAR | Status: DC | PRN
  Administered 2019-02-05: 30 mL via PERINEURAL

## 2019-02-05 MED ORDER — METHOCARBAMOL 750 MG PO TABS: 750.0000 mg | ORAL_TABLET | Freq: Two times a day (BID) | ORAL | 0 refills | Status: DC | PRN

## 2019-02-05 MED ORDER — OXYCODONE HCL ER 10 MG PO T12A: 10.0000 mg | EXTENDED_RELEASE_TABLET | Freq: Two times a day (BID) | ORAL | 0 refills | Status: AC

## 2019-02-05 MED ORDER — PROPOFOL 500 MG/50ML IV EMUL
INTRAVENOUS | Status: AC
  Filled 2019-02-05: qty 50

## 2019-02-05 MED ORDER — CEFAZOLIN SODIUM-DEXTROSE 2-4 GM/100ML-% IV SOLN
INTRAVENOUS | Status: AC
  Filled 2019-02-05: qty 100

## 2019-02-05 MED ORDER — ALBUTEROL SULFATE (2.5 MG/3ML) 0.083% IN NEBU
INHALATION_SOLUTION | RESPIRATORY_TRACT | Status: AC
  Filled 2019-02-05: qty 3

## 2019-02-05 MED ORDER — SCOPOLAMINE 1 MG/3DAYS TD PT72: 1.0000 | MEDICATED_PATCH | Freq: Once | TRANSDERMAL | Status: DC | PRN

## 2019-02-05 MED ORDER — LIDOCAINE 2% (20 MG/ML) 5 ML SYRINGE
INTRAMUSCULAR | Status: AC
  Filled 2019-02-05: qty 5

## 2019-02-05 MED ORDER — KETOROLAC TROMETHAMINE 30 MG/ML IJ SOLN: 30.0000 mg | Freq: Once | INTRAMUSCULAR | Status: DC | PRN

## 2019-02-05 MED ORDER — ONDANSETRON HCL 4 MG/2ML IJ SOLN
INTRAMUSCULAR | Status: DC | PRN
  Administered 2019-02-05: 4 mg via INTRAVENOUS

## 2019-02-05 SURGICAL SUPPLY — 91 items
BANDAGE ACE 4X5 VEL STRL LF (GAUZE/BANDAGES/DRESSINGS) ×6 IMPLANT
BIT DRILL 2.5X2.75 QC CALB (BIT) ×2 IMPLANT
BIT DRILL 3.5X5.5 QC CALB (BIT) ×2 IMPLANT
BIT DRILL CALIBRATED 2.7 (BIT) ×1 IMPLANT
BIT DRILL CALIBRATED 2.7MM (BIT) ×1
BLADE HEX COATED 2.75 (ELECTRODE) ×3 IMPLANT
BLADE SURG 15 STRL LF DISP TIS (BLADE) ×1 IMPLANT
BLADE SURG 15 STRL SS (BLADE) ×2
BNDG COHESIVE 4X5 TAN STRL (GAUZE/BANDAGES/DRESSINGS) ×5 IMPLANT
BNDG ESMARK 4X9 LF (GAUZE/BANDAGES/DRESSINGS) ×3 IMPLANT
CLOSURE STERI-STRIP 1/2X4 (GAUZE/BANDAGES/DRESSINGS) ×2
CLOSURE WOUND 1/4X4 (GAUZE/BANDAGES/DRESSINGS)
CLSR STERI-STRIP ANTIMIC 1/2X4 (GAUZE/BANDAGES/DRESSINGS) ×2 IMPLANT
CORD BIPOLAR FORCEPS 12FT (ELECTRODE) ×3 IMPLANT
COVER BACK TABLE 60X90IN (DRAPES) ×3 IMPLANT
COVER WAND RF STERILE (DRAPES) IMPLANT
CUFF TOURN SGL LL 18 NRW (TOURNIQUET CUFF) ×6 IMPLANT
CUFF TOURNIQUET SINGLE 24IN (TOURNIQUET CUFF) IMPLANT
DECANTER SPIKE VIAL GLASS SM (MISCELLANEOUS) IMPLANT
DERMABOND ADVANCED (GAUZE/BANDAGES/DRESSINGS) ×2
DERMABOND ADVANCED .7 DNX12 (GAUZE/BANDAGES/DRESSINGS) ×1 IMPLANT
DRAPE C-ARM 42X72 X-RAY (DRAPES) ×3 IMPLANT
DRAPE C-ARMOR (DRAPES) ×3 IMPLANT
DRAPE EXTREMITY T 121X128X90 (DISPOSABLE) ×3 IMPLANT
DRAPE IMP U-DRAPE 54X76 (DRAPES) ×3 IMPLANT
DRAPE INCISE IOBAN 66X45 STRL (DRAPES) ×3 IMPLANT
DRAPE SURG 17X23 STRL (DRAPES) ×3 IMPLANT
DRSG PAD ABDOMINAL 8X10 ST (GAUZE/BANDAGES/DRESSINGS) ×4 IMPLANT
GAUZE SPONGE 4X4 12PLY STRL (GAUZE/BANDAGES/DRESSINGS) ×3 IMPLANT
GAUZE XEROFORM 1X8 LF (GAUZE/BANDAGES/DRESSINGS) ×3 IMPLANT
GLOVE BIOGEL PI IND STRL 7.0 (GLOVE) ×1 IMPLANT
GLOVE BIOGEL PI IND STRL 7.5 (GLOVE) IMPLANT
GLOVE BIOGEL PI INDICATOR 7.0 (GLOVE) ×2
GLOVE BIOGEL PI INDICATOR 7.5 (GLOVE) ×2
GLOVE ECLIPSE 7.0 STRL STRAW (GLOVE) ×5 IMPLANT
GLOVE SKINSENSE NS SZ7.5 (GLOVE) ×2
GLOVE SKINSENSE STRL SZ7.5 (GLOVE) ×1 IMPLANT
GLOVE SURG SYN 7.5  E (GLOVE) ×4
GLOVE SURG SYN 7.5 E (GLOVE) ×2 IMPLANT
GLOVE SURG SYN 7.5 PF PI (GLOVE) ×1 IMPLANT
GLOVE SURG SYN 8.0 (GLOVE) ×3 IMPLANT
GLOVE SURG SYN 8.0 PF PI (GLOVE) IMPLANT
GOWN STRL REIN XL XLG (GOWN DISPOSABLE) ×3 IMPLANT
GOWN STRL REUS W/ TWL LRG LVL3 (GOWN DISPOSABLE) ×1 IMPLANT
GOWN STRL REUS W/ TWL XL LVL3 (GOWN DISPOSABLE) ×1 IMPLANT
GOWN STRL REUS W/TWL LRG LVL3 (GOWN DISPOSABLE) ×2
GOWN STRL REUS W/TWL XL LVL3 (GOWN DISPOSABLE) ×2
MANIFOLD NEPTUNE II (INSTRUMENTS) ×3 IMPLANT
NDL HYPO 25X1 1.5 SAFETY (NEEDLE) IMPLANT
NEEDLE HYPO 25X1 1.5 SAFETY (NEEDLE) IMPLANT
NS IRRIG 1000ML POUR BTL (IV SOLUTION) ×3 IMPLANT
PACK BASIN DAY SURGERY FS (CUSTOM PROCEDURE TRAY) ×3 IMPLANT
PAD CAST 4YDX4 CTTN HI CHSV (CAST SUPPLIES) ×1 IMPLANT
PADDING CAST ABS 4INX4YD NS (CAST SUPPLIES)
PADDING CAST ABS COTTON 4X4 ST (CAST SUPPLIES) IMPLANT
PADDING CAST COTTON 4X4 STRL (CAST SUPPLIES) ×2
PENCIL BUTTON HOLSTER BLD 10FT (ELECTRODE) ×3 IMPLANT
PLATE HUMERUS 21H (Plate) ×2 IMPLANT
SCREW CORTICAL 3.5MM  20MM (Screw) ×2 IMPLANT
SCREW CORTICAL 3.5MM 18MM (Screw) ×2 IMPLANT
SCREW CORTICAL 3.5MM 20MM (Screw) IMPLANT
SCREW LOCK CORT STAR 3.5X12 (Screw) ×4 IMPLANT
SCREW LOCK CORT STAR 3.5X14 (Screw) ×2 IMPLANT
SCREW LOCK CORT STAR 3.5X20 (Screw) ×2 IMPLANT
SCREW LP NL T15 3.5X20 (Screw) ×12 IMPLANT
SHEET MEDIUM DRAPE 40X70 STRL (DRAPES) ×3 IMPLANT
SLEEVE SCD COMPRESS KNEE MED (MISCELLANEOUS) ×3 IMPLANT
SLING ARM FOAM STRAP LRG (SOFTGOODS) ×5 IMPLANT
SPLINT FIBERGLASS 4X30 (CAST SUPPLIES) ×3 IMPLANT
SPONGE LAP 18X18 RF (DISPOSABLE) ×3 IMPLANT
STOCKINETTE IMPERVIOUS LG (DRAPES) ×5 IMPLANT
STRIP CLOSURE SKIN 1/4X4 (GAUZE/BANDAGES/DRESSINGS) IMPLANT
SUCTION FRAZIER HANDLE 10FR (MISCELLANEOUS)
SUCTION TUBE FRAZIER 10FR DISP (MISCELLANEOUS) IMPLANT
SUT BROADBAND TAPE 2PK 1.5 (SUTURE) ×2 IMPLANT
SUT ETHILON 3 0 PS 1 (SUTURE) ×3 IMPLANT
SUT MNCRL AB 4-0 PS2 18 (SUTURE) ×2 IMPLANT
SUT VIC AB 0 CT1 27 (SUTURE) ×4
SUT VIC AB 0 CT1 27XBRD ANBCTR (SUTURE) ×1 IMPLANT
SUT VIC AB 2-0 CT1 27 (SUTURE) ×8
SUT VIC AB 2-0 CT1 TAPERPNT 27 (SUTURE) ×1 IMPLANT
SUT VIC AB 2-0 SH 27 (SUTURE) ×2
SUT VIC AB 2-0 SH 27XBRD (SUTURE) IMPLANT
SYR BULB 3OZ (MISCELLANEOUS) ×3 IMPLANT
SYR CONTROL 10ML LL (SYRINGE) IMPLANT
TOWEL GREEN STERILE FF (TOWEL DISPOSABLE) ×6 IMPLANT
TUBE CONNECTING 20'X1/4 (TUBING) ×1
TUBE CONNECTING 20X1/4 (TUBING) ×2 IMPLANT
UNDERPAD 30X30 (UNDERPADS AND DIAPERS) ×3 IMPLANT
WASHER 3.5MM (Orthopedic Implant) ×8 IMPLANT
YANKAUER SUCT BULB TIP NO VENT (SUCTIONS) ×3 IMPLANT

## 2019-02-05 NOTE — H&P (Signed)
PREOPERATIVE H&P  Chief Complaint: right humeral shaft fracture  HPI: Julie Lyons is a 38 y.o. female who presents for surgical treatment of right humeral shaft fracture.  She denies any changes in medical history.  Past Medical History:  Diagnosis Date  . Asthma    albuterol inhaler-not used for several months  . GERD (gastroesophageal reflux disease)    occas- no meds  . Headache(784.0)   . History of kidney stones   . Ovarian cyst    Past Surgical History:  Procedure Laterality Date  . CERVICAL CERCLAGE  12/02/2011   Procedure: CERCLAGE CERVICAL;  Surgeon: Tereso Newcomer, MD;  Location: [facility];  Service: Gynecology;  Laterality: N/A;  . CERVICAL CERCLAGE  05/15/2012   Procedure: CERCLAGE CERVICAL;  Surgeon: Adam Phenix, MD;  Location: [facility];  Service: Gynecology;  Laterality: N/A;  removal of cerclage from cervix  . OOPHORECTOMY     left ovary and tube  . TONSILLECTOMY    . TUBAL LIGATION  08/18/2012   Procedure: ESSURE TUBAL STERILIZATION;  Surgeon: Willodean Rosenthal, MD;  Location: [facility];  Service: Gynecology;  Laterality: Left;  Marland Kitchen VAGINAL DELIVERY  1997, 2000. 2004   Social History   Socioeconomic History  . Marital status: Single    Spouse name: Not on file  . Number of children: Not on file  . Years of education: Not on file  . Highest education level: Not on file  Occupational History  . Not on file  Social Needs  . Financial resource strain: Not on file  . Food insecurity:    Worry: Not on file    Inability: Not on file  . Transportation needs:    Medical: Not on file    Non-medical: Not on file  Tobacco Use  . Smoking status: Current Every Day Smoker    Packs/day: 0.25    Years: 10.00    Pack years: 2.50    Types: Cigarettes  . Smokeless tobacco: Never Used  Substance and Sexual Activity  . Alcohol use: No    Comment: occassionally  . Drug use: No  . Sexual activity: Yes  Lifestyle  . Physical activity:    Days per week: Not  on file    Minutes per session: Not on file  . Stress: Not on file  Relationships  . Social connections:    Talks on phone: Not on file    Gets together: Not on file    Attends religious service: Not on file    Active member of club or organization: Not on file    Attends meetings of clubs or organizations: Not on file    Relationship status: Not on file  Other Topics Concern  . Not on file  Social History Narrative  . Not on file   Family History  Problem Relation Age of Onset  . Hypertension Father   . Anesthesia problems Mother    No Known Allergies Prior to Admission medications   Medication Sig Start Date End Date Taking? Authorizing Provider  acetaminophen (TYLENOL) 500 MG tablet Take 2 tablets (1,000 mg total) by mouth every 8 (eight) hours for 5 days. Do not take more than 4000 mg of acetaminophen (Tylenol) in a 24-hour period. Please note that other medicines that you may be prescribed may have Tylenol as well. 01/31/19 02/05/19 Yes Cardama, Amadeo Garnet, MD  cyclobenzaprine (FLEXERIL) 10 MG tablet Take 0.5-1 tablets (5-10 mg total) by mouth 3 (three) times daily as needed for  up to 10 days for muscle spasms. 01/31/19 02/10/19 Yes Cardama, Amadeo Garnet, MD  oxyCODONE (ROXICODONE) 5 MG immediate release tablet Take 1 tablet (5 mg total) by mouth every 6 (six) hours as needed for up to 5 days for severe pain. 01/31/19 02/05/19 Yes Cardama, Amadeo Garnet, MD  ALBUTEROL IN Inhale 2 puffs into the lungs.    [provider]     Positive ROS: All other systems have been reviewed and were otherwise negative with the exception of those mentioned in the HPI and as above.  Physical Exam: General: Alert, no acute distress Cardiovascular: No pedal edema Respiratory: No cyanosis, no use of accessory musculature GI: abdomen soft Skin: No lesions in the area of chief complaint Neurologic: Sensation intact distally Psychiatric: Patient is competent for consent with normal mood and  affect Lymphatic: no lymphedema  MUSCULOSKELETAL: exam stable  Assessment: right humeral shaft fracture  Plan: Plan for Procedure(s): OPEN REDUCTION INTERNAL FIXATION (ORIF)  RIGHT HUMERAL SHAFT FRACTURE  The risks benefits and alternatives were discussed with the patient including but not limited to the risks of nonoperative treatment, versus surgical intervention including infection, bleeding, nerve injury,  blood clots, cardiopulmonary complications, morbidity, mortality, among others, and they were willing to proceed.   Glee Arvin, MD   02/05/2019 8:03 AM

## 2019-02-05 NOTE — Anesthesia Procedure Notes (Signed)
Procedure Name: LMA Insertion Date/Time: 02/05/2019 2:45 PM Performed by: Shanon Payor, CRNA Pre-anesthesia Checklist: Patient identified, Emergency Drugs available, Suction available, Patient being monitored and Timeout performed Patient Re-evaluated:Patient Re-evaluated prior to induction Oxygen Delivery Method: Circle system utilized Preoxygenation: Pre-oxygenation with 100% oxygen Induction Type: IV induction LMA: LMA inserted LMA Size: 4.0 Number of attempts: 1 Placement Confirmation: positive ETCO2 and breath sounds checked- equal and bilateral Dental Injury: Teeth and Oropharynx as per pre-operative assessment

## 2019-02-05 NOTE — Discharge Instructions (Signed)
 Postoperative instructions:  Weightbearing instructions: non weight bearing  Dressing instructions: Keep your dressing and/or splint clean and dry at all times.  It will be removed at your first post-operative appointment.  Your stitches and/or staples will be removed at this visit.  Incision instructions:  Do not soak your incision for 3 weeks after surgery.  If the incision gets wet, pat dry and do not scrub the incision.  Pain control:  You have been given a prescription to be taken as directed for post-operative pain control.  In addition, elevate the operative extremity above the heart at all times to prevent swelling and throbbing pain.  Take over-the-counter Colace, 100mg by mouth twice a day while taking narcotic pain medications to help prevent constipation.  Follow up appointments: 1) 7 days for wound check. 2) Dr. Xu as scheduled.   -------------------------------------------------------------------------------------------------------------  After Surgery Pain Control:  After your surgery, post-surgical discomfort or pain is likely. This discomfort can last several days to a few weeks. At certain times of the day your discomfort may be more intense.  Did you receive a nerve block?  A nerve block can provide pain relief for one hour to two days after your surgery. As long as the nerve block is working, you will experience little or no sensation in the area the surgeon operated on.  As the nerve block wears off, you will begin to experience pain or discomfort. It is very important that you begin taking your prescribed pain medication before the nerve block fully wears off. Treating your pain at the first sign of the block wearing off will ensure your pain is better controlled and more tolerable when full-sensation returns. Do not wait until the pain is intolerable, as the medicine will be less effective. It is better to treat pain in advance than to try and catch up.  General  Anesthesia:  If you did not receive a nerve block during your surgery, you will need to start taking your pain medication shortly after your surgery and should continue to do so as prescribed by your surgeon.  Pain Medication:  Most commonly we prescribe Vicodin and Percocet for post-operative pain. Both of these medications contain a combination of acetaminophen (Tylenol) and a narcotic to help control pain.   It takes between 30 and 45 minutes before pain medication starts to work. It is important to take your medication before your pain level gets too intense.   Nausea is a common side effect of many pain medications. You will want to eat something before taking your pain medicine to help prevent nausea.   If you are taking a prescription pain medication that contains acetaminophen, we recommend that you do not take additional over the counter acetaminophen (Tylenol).  Other pain relieving options:   Using a cold pack to ice the affected area a few times a day (15 to 20 minutes at a time) can help to relieve pain, reduce swelling and bruising.   Elevation of the affected area can also help to reduce pain and swelling.   Post Anesthesia Home Care Instructions  Activity: Get plenty of rest for the remainder of the day. A responsible individual must stay with you for 24 hours following the procedure.  For the next 24 hours, DO NOT: -Drive a car -Operate machinery -Drink alcoholic beverages -Take any medication unless instructed by your physician -Make any legal decisions or sign important papers.  Meals: Start with liquid foods such as gelatin or soup. Progress to   regular foods as tolerated. Avoid greasy, spicy, heavy foods. If nausea and/or vomiting occur, drink only clear liquids until the nausea and/or vomiting subsides. Call your physician if vomiting continues.  Special Instructions/Symptoms: Your throat may feel dry or sore from the anesthesia or the breathing tube placed in  your throat during surgery. If this causes discomfort, gargle with warm salt water. The discomfort should disappear within 24 hours.  If you had a scopolamine patch placed behind your ear for the management of post- operative nausea and/or vomiting:  1. The medication in the patch is effective for 72 hours, after which it should be removed.  Wrap patch in a tissue and discard in the trash. Wash hands thoroughly with soap and water. 2. You may remove the patch earlier than 72 hours if you experience unpleasant side effects which may include dry mouth, dizziness or visual disturbances. 3. Avoid touching the patch. Wash your hands with soap and water after contact with the patch.     Regional Anesthesia Blocks  1. Numbness or the inability to move the "blocked" extremity may last from 3-48 hours after placement. The length of time depends on the medication injected and your individual response to the medication. If the numbness is not going away after 48 hours, call your surgeon.  2. The extremity that is blocked will need to be protected until the numbness is gone and the  Strength has returned. Because you cannot feel it, you will need to take extra care to avoid injury. Because it may be weak, you may have difficulty moving it or using it. You may not know what position it is in without looking at it while the block is in effect.  3. For blocks in the legs and feet, returning to weight bearing and walking needs to be done carefully. You will need to wait until the numbness is entirely gone and the strength has returned. You should be able to move your leg and foot normally before you try and bear weight or walk. You will need someone to be with you when you first try to ensure you do not fall and possibly risk injury.  4. Bruising and tenderness at the needle site are common side effects and will resolve in a few days.  5. Persistent numbness or new problems with movement should be communicated to  the surgeon or the [facility] ([phone])/ [facility] ([phone]).   

## 2019-02-05 NOTE — Anesthesia Preprocedure Evaluation (Signed)
Anesthesia Evaluation  Patient identified by MRN, date of birth, ID band Patient awake    Reviewed: Allergy & Precautions, NPO status , Patient's Chart, lab work & pertinent test results  Airway Mallampati: I  TM Distance: >3 FB Neck ROM: Full    Dental   Pulmonary asthma , Current Smoker,    Pulmonary exam normal        Cardiovascular hypertension, Normal cardiovascular exam     Neuro/Psych    GI/Hepatic   Endo/Other    Renal/GU      Musculoskeletal   Abdominal   Peds  Hematology   Anesthesia Other Findings   Reproductive/Obstetrics                             Anesthesia Physical Anesthesia Plan  ASA: II  Anesthesia Plan: Regional   Post-op Pain Management:    Induction: Intravenous  PONV Risk Score and Plan: 1  Airway Management Planned: Simple Face Mask  Additional Equipment:   Intra-op Plan:   Post-operative Plan:   Informed Consent: I have reviewed the patients History and Physical, chart, labs and discussed the procedure including the risks, benefits and alternatives for the proposed anesthesia with the patient or authorized representative who has indicated his/her understanding and acceptance.       Plan Discussed with: CRNA and Surgeon  Anesthesia Plan Comments:         Anesthesia Quick Evaluation

## 2019-02-05 NOTE — Transfer of Care (Signed)
Immediate Anesthesia Transfer of Care Note  Patient: Julie Lyons  Procedure(s) Performed: OPEN REDUCTION INTERNAL FIXATION (ORIF)  RIGHT HUMERAL SHAFT FRACTURE (Right Arm Upper)  Patient Location: PACU  Anesthesia Type:General  Level of Consciousness: awake, alert  and oriented  Airway & Oxygen Therapy: Patient Spontanous Breathing and Patient connected to face mask oxygen  Post-op Assessment: Report given to RN and Post -op Vital signs reviewed and stable  Post vital signs: Reviewed and stable  Last Vitals:  Vitals Value Taken Time  BP    Temp    Pulse 84 02/05/2019  5:11 PM  Resp    SpO2 98 % 02/05/2019  5:11 PM  Vitals shown include unvalidated device data.  Last Pain:  Vitals:   02/05/19 1208  TempSrc: Oral  PainSc: 8       Patients Stated Pain Goal: 5 (02/05/19 1208)  Complications: No apparent anesthesia complications

## 2019-02-05 NOTE — Progress Notes (Signed)
Assisted Dr. Ossey with right, ultrasound guided, supraclavicular block. Side rails up, monitors on throughout procedure. See vital signs in flow sheet. Tolerated Procedure well. 

## 2019-02-05 NOTE — Op Note (Signed)
Date of Surgery: 02/05/2019  INDICATIONS: Ms. Julie Lyons is a 38 y.o.-year-old female with a right humeral shaft fracture;  The patient did consent to the procedure after discussion of the risks and benefits.  PREOPERATIVE DIAGNOSIS: Right humeral shaft fracture  POSTOPERATIVE DIAGNOSIS: Same.  PROCEDURE:  1. Open reduction internal fixation of right humeral shaft fracture 2. Neurolysis of right radial nerve  SURGEON: N. Glee ArvinMichael Athenia Rys, M.D.  ASSIST: Starlyn SkeansMary Lindsey OtisStanbery, [place]; necessary for the timely completion of procedure and due to complexity of procedure.  ANESTHESIA:  general, regional block  IV FLUIDS AND URINE: See anesthesia.  ESTIMATED BLOOD LOSS: 150 mL.  IMPLANTS: Biomet  DRAINS: none  COMPLICATIONS: None.  DESCRIPTION OF PROCEDURE: The patient was brought to the operating room and placed supine on the operating table.  The patient had been signed prior to the procedure and this was documented. The patient had the anesthesia placed by the anesthesiologist.  A time-out was performed to confirm that this was the correct patient, site, side and location. The patient did receive antibiotics prior to the incision and was re-dosed during the procedure as needed at indicated intervals.  The patient had the operative extremity prepped and draped in the standard surgical fashion.    A posterior incision was placed over the upper arm and around the elbow.  Dissection was carried down through the subcutaneous fat onto the triceps muscle.  Full-thickness flaps were elevated.  The triceps muscle was then split in line with the muscle fibers and through the fascia.  We continued our blunt dissection down onto the humeral shaft.  A Cobb elevator was used to perform subperiosteal elevation.  The radial nerve was then identified in its anatomic position and I performed neurolysis of the radial nerve both proximally and distally in order to mobilize this away from the humeral shaft.  A vessel  loop was placed around the radial nerve for identification.  The fracture lines were then visualized.  There were 2 butterfly fragments.  The main butterfly fragment was reduced back to the proximal segment and a single 3.5 mm lag screw was placed with excellent purchase and compression across the fracture site.  The second butterfly fragment was too small to lag.  I then restored the overall alignment and reduction of the distal humeral shaft to the proximal segment.  I used a suture tape to cerclage the small butterfly fragment to the distal humeral shaft provisionally.  A precontoured posterior lateral humeral plate was placed at the appropriate position using fluoroscopic guidance.  I made sure that the plate was slid underneath the radial nerve and that the plate was directly on the bony surface.  I placed a couple of nonlocking screws proximally each with excellent fixation.  I then placed 2 nonlocking bicortical screws distal to the fracture also with excellent fixation.  X-rays were then taken to confirm appropriate positioning of the plate and hardware placement.  I then placed additional locking and nonlocking screws through the plate using standard AO technique and principles.  Final x-rays were taken.  The wound was then thoroughly irrigated with 3 L of normal saline.  Surgical wound was then closed with 0 Vicryl, 2-0 Vicryl, 4-0 Monocryl.  Sterile dressings were applied.  Patient tolerated procedure well had no immediate complications.  She was placed in a long-arm splint and 90 degrees.  POSTOPERATIVE PLAN: Patient will be nonweightbearing to the right upper extremity.  She will follow-up in 1 week for initiation of elbow range  of motion.  Mayra Reel, MD [facility] [phone] 11:36 AM

## 2019-02-05 NOTE — Anesthesia Procedure Notes (Signed)
Anesthesia Regional Block: Supraclavicular block   Pre-Anesthetic Checklist: ,, timeout performed, Correct Patient, Correct Site, Correct Laterality, Correct Procedure, Correct Position, site marked, Risks and benefits discussed,  Surgical consent,  Pre-op evaluation,  At surgeon's request and post-op pain management  Laterality: Right  Prep: chloraprep       Needles:   Needle Type: Echogenic Stimulator Needle     Needle Length: 9cm      Additional Needles:   Procedures:, nerve stimulator,,,,,,,   Nerve Stimulator or Paresthesia:  Response: 0.4 mA,   Additional Responses:   Narrative:  Start time: 02/05/2019 12:22 PM End time: 02/05/2019 12:32 PM Injection made incrementally with aspirations every 5 mL.  Performed by: Personally  Anesthesiologist: Arta Brucessey, Ronniesha Seibold, MD  Additional Notes: Monitors applied. Patient sedated. Sterile prep and drape,hand hygiene and sterile gloves were used. Relevant anatomy identified.Needle position confirmed.Local anesthetic injected incrementally after negative aspiration. Local anesthetic spread visualized around nerve(s). Vascular puncture avoided. No complications. Image printed for medical record.The patient tolerated the procedure well.

## 2019-02-06 NOTE — Anesthesia Postprocedure Evaluation (Signed)
Anesthesia Post Note  Patient: Julie Lyons  Procedure(s) Performed: OPEN REDUCTION INTERNAL FIXATION (ORIF)  RIGHT HUMERAL SHAFT FRACTURE (Right Arm Upper)     Anesthesia Post Evaluation  Last Vitals:  Vitals:   02/05/19 1745 02/05/19 1800  BP: 111/69 119/65  Pulse: (!) 59 78  Resp: 16 16  Temp:  (!) 36.4 C  SpO2: 96% 94%    Last Pain:  Vitals:   02/05/19 1800  TempSrc:   PainSc: 0-No pain                 Nikie Cid

## 2019-02-08 ENCOUNTER — Encounter (HOSPITAL_BASED_OUTPATIENT_CLINIC_OR_DEPARTMENT_OTHER): Payer: Self-pay | Admitting: Orthopaedic Surgery

## 2019-02-09 NOTE — Addendum Note (Signed)
Addendum  created 02/09/19 0919 by Dquan Cortopassi, Jewel Baize, CRNA   Charge Capture section accepted, Visit diagnoses modified

## 2019-02-12 ENCOUNTER — Ambulatory Visit (INDEPENDENT_AMBULATORY_CARE_PROVIDER_SITE_OTHER): Payer: Self-pay

## 2019-02-12 ENCOUNTER — Other Ambulatory Visit (INDEPENDENT_AMBULATORY_CARE_PROVIDER_SITE_OTHER): Payer: Self-pay | Admitting: Physician Assistant

## 2019-02-12 ENCOUNTER — Encounter (INDEPENDENT_AMBULATORY_CARE_PROVIDER_SITE_OTHER): Payer: Self-pay | Admitting: Orthopaedic Surgery

## 2019-02-12 ENCOUNTER — Encounter (INDEPENDENT_AMBULATORY_CARE_PROVIDER_SITE_OTHER): Payer: Self-pay

## 2019-02-12 ENCOUNTER — Ambulatory Visit (INDEPENDENT_AMBULATORY_CARE_PROVIDER_SITE_OTHER): Payer: Self-pay | Admitting: Physician Assistant

## 2019-02-12 DIAGNOSIS — S42351A Displaced comminuted fracture of shaft of humerus, right arm, initial encounter for closed fracture: Secondary | ICD-10-CM

## 2019-02-12 MED ORDER — OXYCODONE-ACETAMINOPHEN 5-325 MG PO TABS: 1.0000 | ORAL_TABLET | Freq: Three times a day (TID) | ORAL | 0 refills | Status: DC | PRN

## 2019-02-12 NOTE — Progress Notes (Signed)
Post-Op Visit Note   Patient: Julie Lyons           Date of Birth: 11/30/1981           MRN: [id number] Visit Date: 02/12/2019 PCP: Patient, No Pcp Per   Assessment & Plan:  Chief Complaint:  Chief Complaint  Patient presents with  . Right Shoulder - Pain, Routine Post Op    02/05/2019-HUMERUS FX   Visit Diagnoses:  1. Closed displaced comminuted fracture of shaft of right humerus, initial encounter     Plan: Patient is a pleasant 38 year old female who presents our clinic today 7 days status post ORIF right humeral shaft fracture, date of surgery 02/05/2019.  She has been doing okay since surgery.  She has been taking oxycodone and Robaxin with moderate relief of symptoms.  She has been compliant in her long-arm splint and sling, but does note she has hit her arm a few times since surgery.  Examination of her right arm shows a well-healing surgical incision without evidence of infection.  She has moderate swelling throughout.  Her fingers are warm and well-perfused.  She is neurovascularly intact distally.  At this point, we will apply new Steri-Strips.  We will have her wear the sling at all times.  She can come out of this for gentle range of motion of the elbow only.  Follow-up with [place] in 3 weeks time for repeat evaluation and x-rays.  Follow-Up Instructions: Return in about 3 weeks (around 03/05/2019).   Orders:  Orders Placed This Encounter  Procedures  . XR Humerus Right   No orders of the defined types were placed in this encounter.   Imaging: Xr Humerus Right  Result Date: 02/12/2019 Stable alignment of the hardware   PMFS History: Patient Active Problem List   Diagnosis Date Noted  . Closed comminuted right humeral fracture 02/05/2019  . 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 Proteinuria complicating pregnancy 01/09/2012  . Genital herpes 12/26/2011  . Supervision of high-risk pregnancy 12/26/2011  . Cervical incompetence with SAB at 16 weeks (silent dilation and  delivery) 11/07/2011  . UTI in pregnancy, antepartum 11/07/2011  . Benign essential hypertension antepartum 11/07/2011  . Asthma 11/07/2011   Past Medical History:  Diagnosis Date  . Asthma    albuterol inhaler-not used for several months  . GERD (gastroesophageal reflux disease)    occas- no meds  . Headache(784.0)   . History of kidney stones   . Ovarian cyst     Family History  Problem Relation Age of Onset  . Hypertension Father   . Anesthesia problems Mother     Past Surgical History:  Procedure Laterality Date  . CERVICAL CERCLAGE  12/02/2011   Procedure: CERCLAGE CERVICAL;  Surgeon: Tereso Newcomer, MD;  Location: [facility];  Service: Gynecology;  Laterality: N/A;  . CERVICAL CERCLAGE  05/15/2012   Procedure: CERCLAGE CERVICAL;  Surgeon: Adam Phenix, MD;  Location: [facility];  Service: Gynecology;  Laterality: N/A;  removal of cerclage from cervix  . OOPHORECTOMY     left ovary and tube  . ORIF HUMERUS FRACTURE Right 02/05/2019   Procedure: OPEN REDUCTION INTERNAL FIXATION (ORIF)  RIGHT HUMERAL SHAFT FRACTURE;  Surgeon: Tarry Kos, MD;  Location: [facility];  Service: Orthopedics;  Laterality: Right;  . TONSILLECTOMY    . TUBAL LIGATION  08/18/2012   Procedure: ESSURE TUBAL STERILIZATION;  Surgeon: Willodean Rosenthal, MD;  Location: [facility];  Service: Gynecology;  Laterality: Left;  Marland Kitchen VAGINAL  DELIVERY  1997, 2000. 2004   Social History   Occupational History  . Not on file  Tobacco Use  . Smoking status: Current Every Day Smoker    Packs/day: 0.25    Years: 10.00    Pack years: 2.50    Types: Cigarettes  . Smokeless tobacco: Never Used  Substance and Sexual Activity  . Alcohol use: Yes    Comment: occassionally  . Drug use: No  . Sexual activity: Yes

## 2019-03-05 ENCOUNTER — Ambulatory Visit (INDEPENDENT_AMBULATORY_CARE_PROVIDER_SITE_OTHER): Payer: Self-pay

## 2019-03-05 ENCOUNTER — Encounter (INDEPENDENT_AMBULATORY_CARE_PROVIDER_SITE_OTHER): Payer: Self-pay | Admitting: Orthopaedic Surgery

## 2019-03-05 ENCOUNTER — Ambulatory Visit (INDEPENDENT_AMBULATORY_CARE_PROVIDER_SITE_OTHER): Payer: Self-pay | Admitting: Orthopaedic Surgery

## 2019-03-05 VITALS — Ht 67.0 in | Wt 223.0 lb

## 2019-03-05 DIAGNOSIS — S42351D Displaced comminuted fracture of shaft of humerus, right arm, subsequent encounter for fracture with routine healing: Secondary | ICD-10-CM

## 2019-03-05 NOTE — Progress Notes (Signed)
Post-Op Visit Note   Patient: Julie Lyons           Date of Birth: 23-Mar-1981           MRN: [id number] Visit Date: 03/05/2019 PCP: Patient, No Pcp Per   Assessment & Plan:  Chief Complaint:  Chief Complaint  Patient presents with  . Right Upper Arm - Routine Post Op    2/7/2020ORIF right humeral fx   Visit Diagnoses:  1. Closed displaced comminuted fracture of shaft of right humerus with routine healing, subsequent encounter     Plan: Julie Lyons is 4 weeks status post ORIF right humerus fracture.  Overall she is doing fine just complains of some surgical scar tenderness.  Her elbow and shoulder range of motion are decent for this period of time.  No signs of infection.  Neurovascularly intact.  At this point we will have her begin PT for passive range of motion and active assisted range of motion.  No strengthening yet.  Continue wearing the sling in public.  Follow-up in 4 weeks with two-view x-rays of the right humerus.  Follow-Up Instructions: Return in about 4 weeks (around 04/02/2019).   Orders:  Orders Placed This Encounter  Procedures  . XR Humerus Right   No orders of the defined types were placed in this encounter.   Imaging: Xr Humerus Right  Result Date: 03/05/2019 Stable fixation and alignment of comminuted humerus fracture   PMFS History: Patient Active Problem List   Diagnosis Date Noted  . Closed comminuted right humeral fracture 02/05/2019  . 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 Proteinuria complicating pregnancy 01/09/2012  . Genital herpes 12/26/2011  . Supervision of high-risk pregnancy 12/26/2011  . Cervical incompetence with SAB at 16 weeks (silent dilation and delivery) 11/07/2011  . UTI in pregnancy, antepartum 11/07/2011  . Benign essential hypertension antepartum 11/07/2011  . Asthma 11/07/2011   Past Medical History:  Diagnosis Date  . Asthma    albuterol inhaler-not used for several months  . GERD (gastroesophageal reflux disease)    occas-  no meds  . Headache(784.0)   . History of kidney stones   . Ovarian cyst     Family History  Problem Relation Age of Onset  . Hypertension Father   . Anesthesia problems Mother     Past Surgical History:  Procedure Laterality Date  . CERVICAL CERCLAGE  12/02/2011   Procedure: CERCLAGE CERVICAL;  Surgeon: Tereso Newcomer, MD;  Location: [facility];  Service: Gynecology;  Laterality: N/A;  . CERVICAL CERCLAGE  05/15/2012   Procedure: CERCLAGE CERVICAL;  Surgeon: Adam Phenix, MD;  Location: [facility];  Service: Gynecology;  Laterality: N/A;  removal of cerclage from cervix  . OOPHORECTOMY     left ovary and tube  . ORIF HUMERUS FRACTURE Right 02/05/2019   Procedure: OPEN REDUCTION INTERNAL FIXATION (ORIF)  RIGHT HUMERAL SHAFT FRACTURE;  Surgeon: Tarry Kos, MD;  Location: [facility];  Service: Orthopedics;  Laterality: Right;  . TONSILLECTOMY    . TUBAL LIGATION  08/18/2012   Procedure: ESSURE TUBAL STERILIZATION;  Surgeon: Willodean Rosenthal, MD;  Location: [facility];  Service: Gynecology;  Laterality: Left;  Marland Kitchen VAGINAL DELIVERY  1997, 2000. 2004   Social History   Occupational History  . Not on file  Tobacco Use  . Smoking status: Current Every Day Smoker    Packs/day: 0.25    Years: 10.00    Pack years: 2.50    Types: Cigarettes  . Smokeless tobacco:  Never Used  Substance and Sexual Activity  . Alcohol use: Yes    Comment: occassionally  . Drug use: No  . Sexual activity: Yes

## 2019-04-02 ENCOUNTER — Ambulatory Visit (INDEPENDENT_AMBULATORY_CARE_PROVIDER_SITE_OTHER): Payer: Self-pay | Admitting: Orthopaedic Surgery

## 2019-04-07 ENCOUNTER — Telehealth (INDEPENDENT_AMBULATORY_CARE_PROVIDER_SITE_OTHER): Payer: Self-pay

## 2019-04-07 NOTE — Telephone Encounter (Signed)
Called patient. No answer. LMOM to return call. If she calls back please ask the screening questions. Thank you.  Do you have now or have you had in the past 7 days a fever and/or chills?   Do you have now or have you had in the past 7 days a cough?   Do you have now or have you had in the last 7 days nausea, vomiting or abdominal pain?   Have you been exposed to anyone who has tested positive for COVID-19?   Have you or anyone who lives with you traveled within the last month? 

## 2019-04-08 ENCOUNTER — Ambulatory Visit (INDEPENDENT_AMBULATORY_CARE_PROVIDER_SITE_OTHER): Payer: Self-pay | Admitting: Orthopaedic Surgery

## 2020-01-29 IMAGING — DX DG SHOULDER 2+V*R*
3 series · 3 of 3 positions shown · non-contrast
Comparison: None.

CLINICAL DATA: Right upper arm pain and deformity after a fall.

EXAM:
RIGHT SHOULDER - 2+ VIEW; RIGHT ELBOW - COMPLETE 3+ VIEW

[shoulder y view]
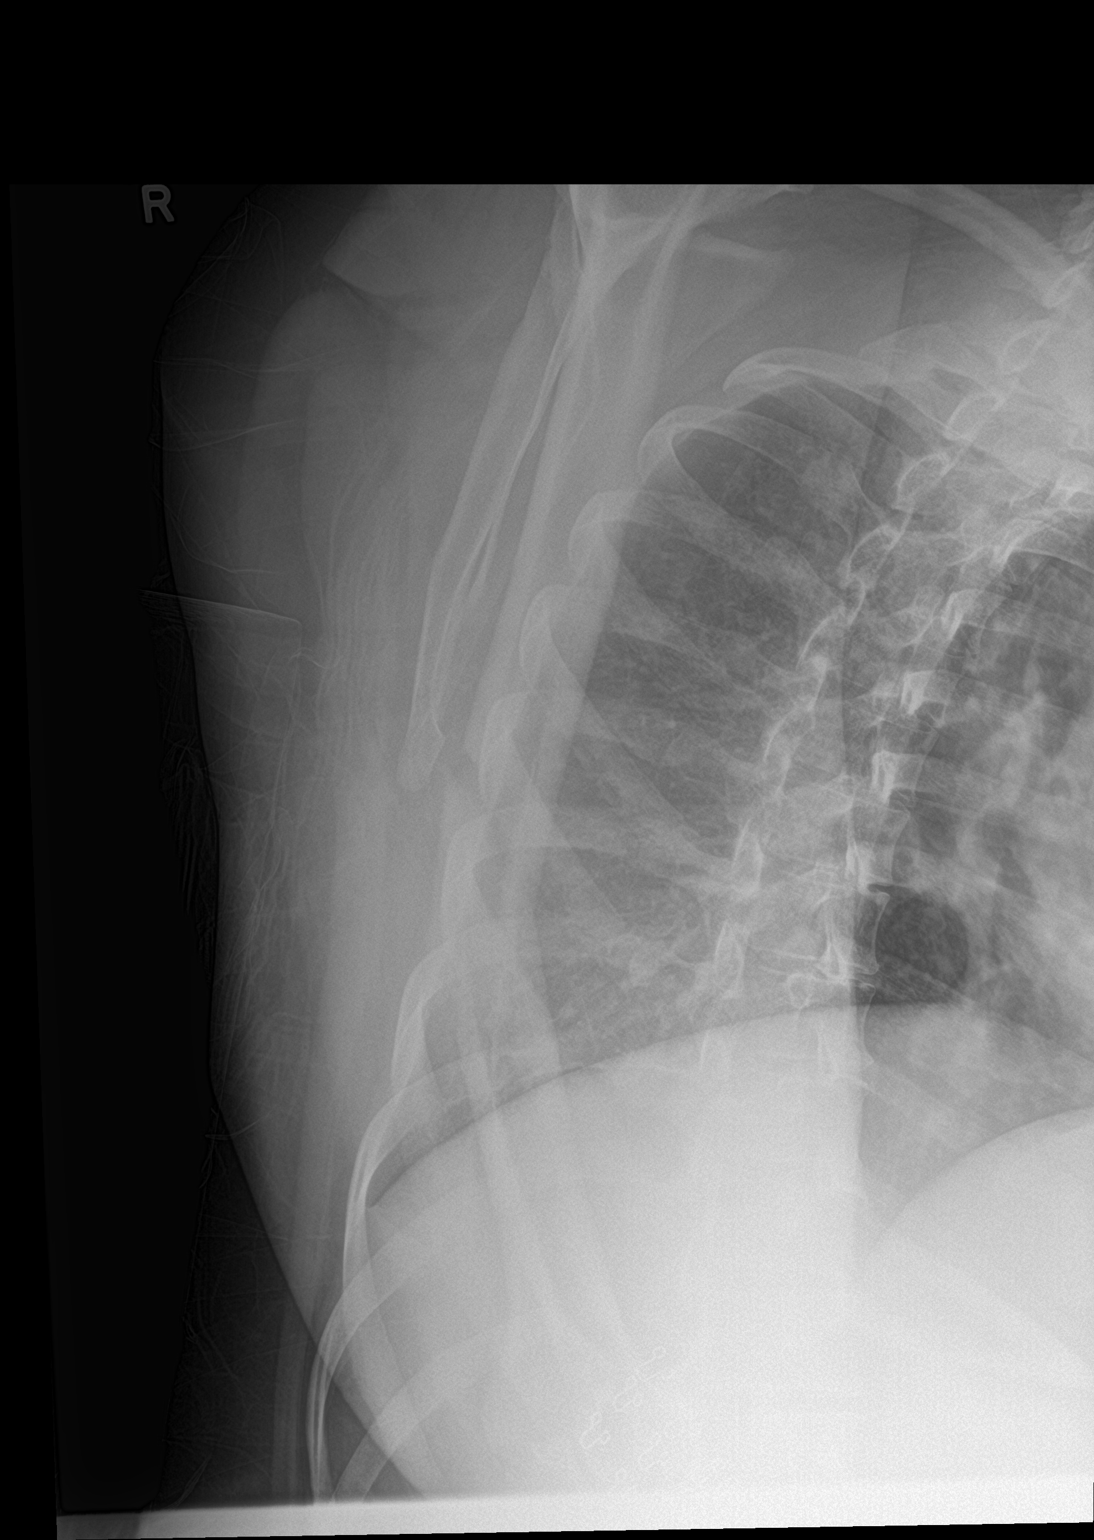

[shoulder ap neutral (1 of 2)]
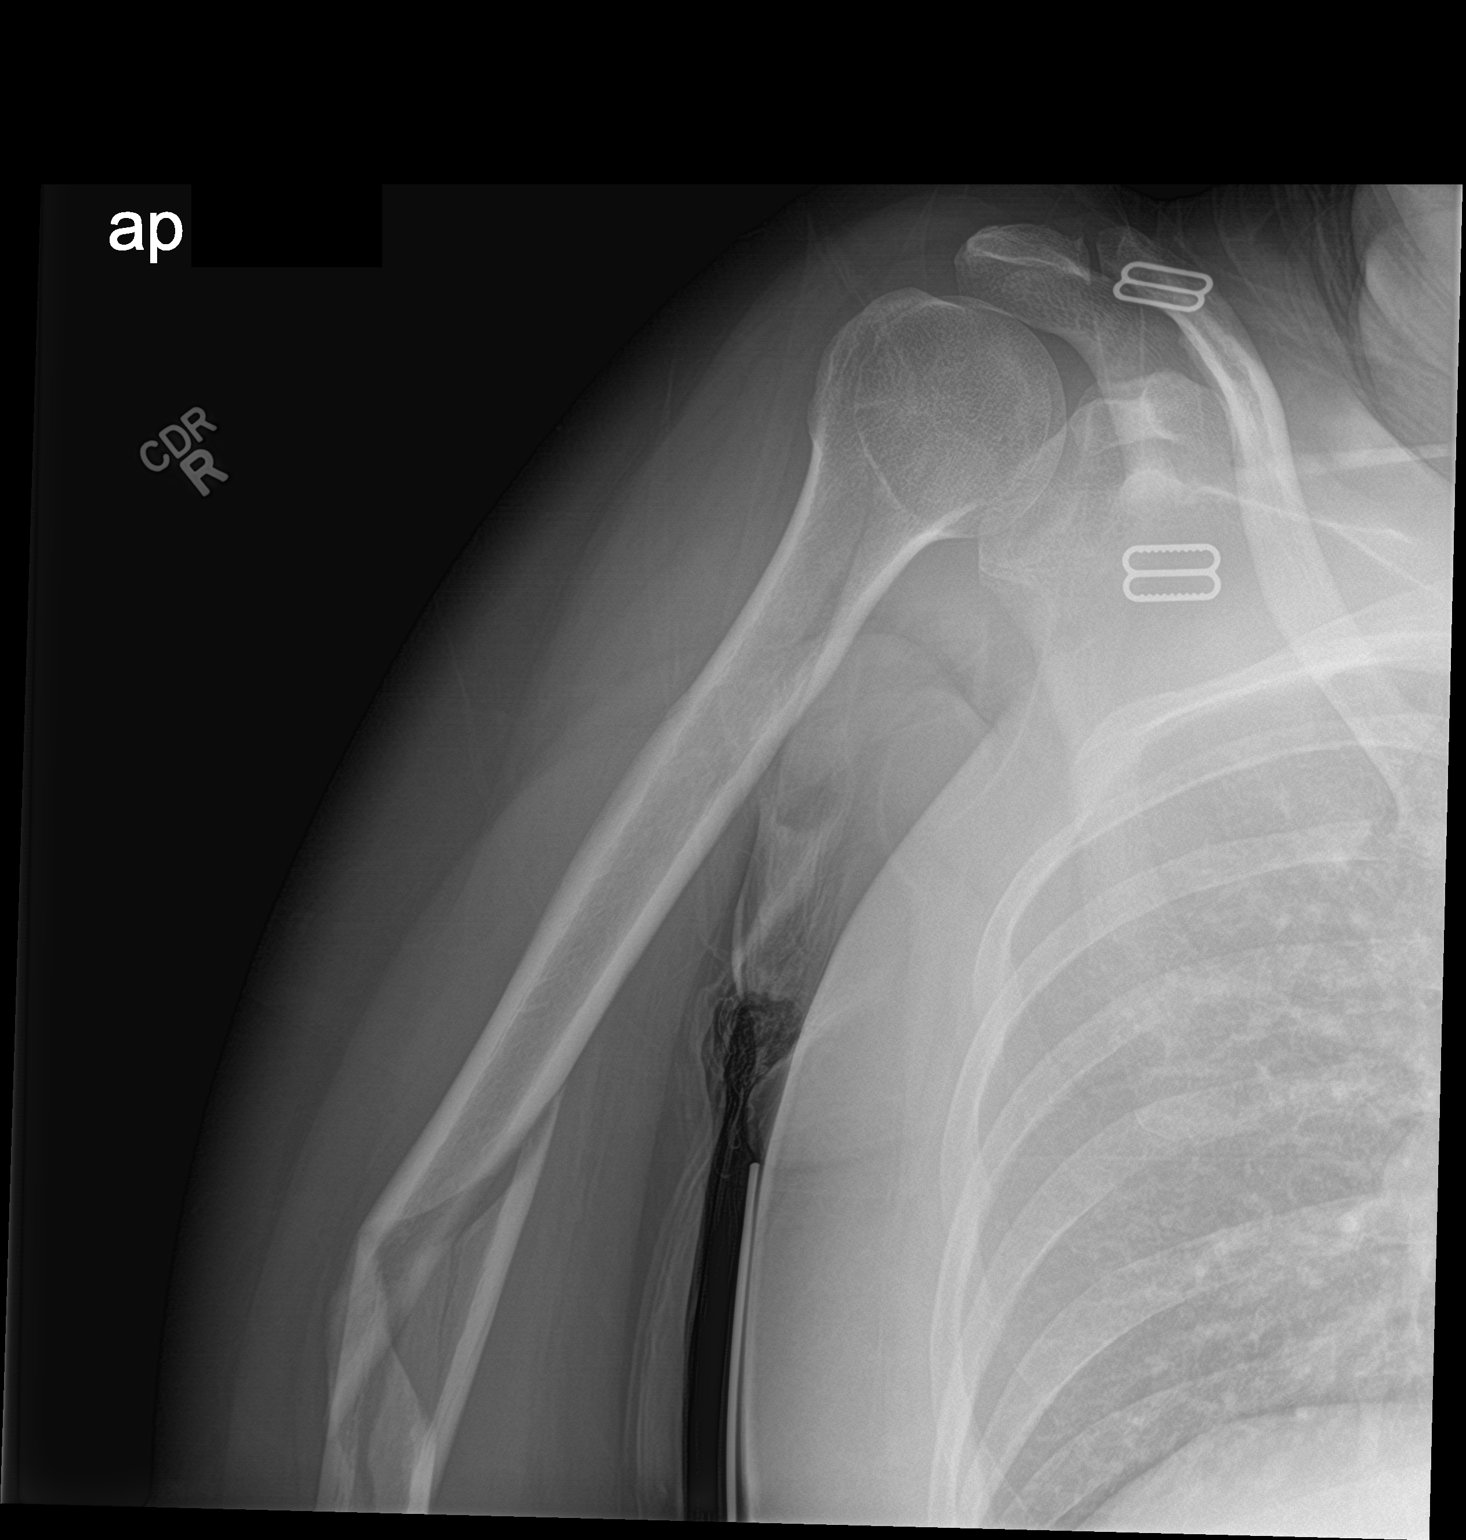

[shoulder ap neutral (2 of 2)]
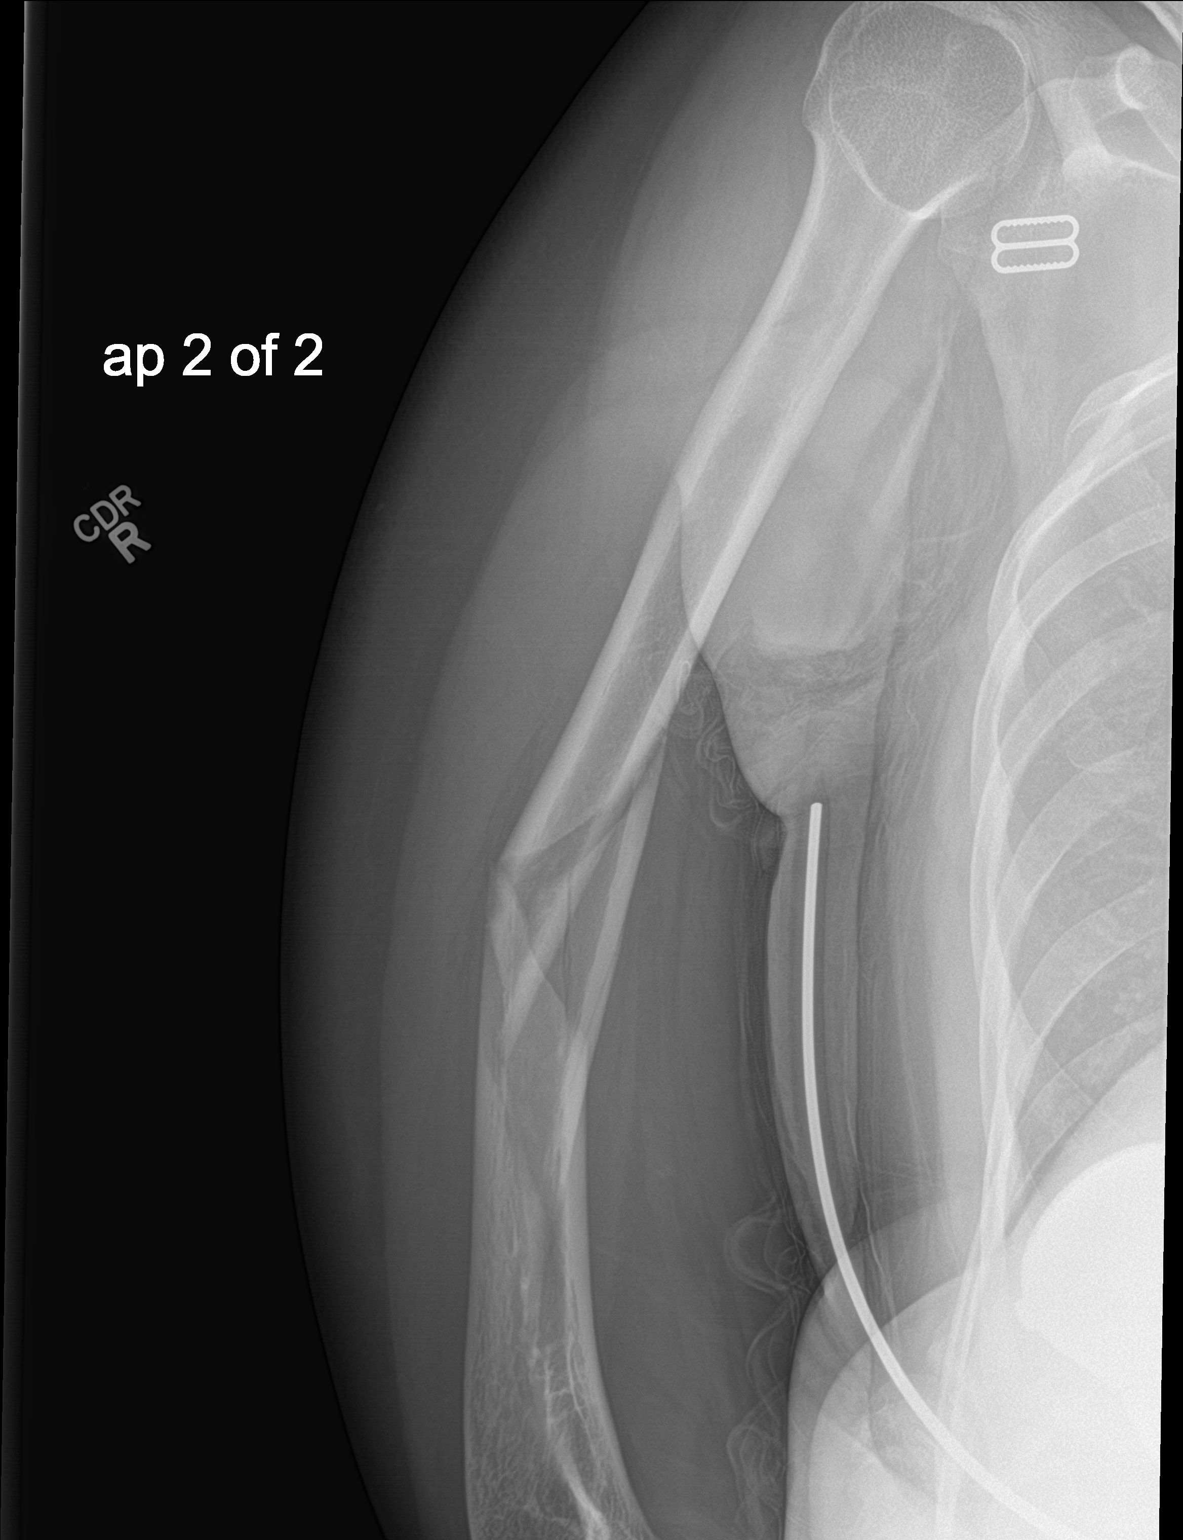

[3 of 3 positions shown; findings below may reference images not displayed]

FINDINGS: Three views of the right humerus and three views of the right elbow
are obtained.

Comminuted spiral oblique fractures of the midshaft right humerus
extending into the distal shaft. Medial angulation of the distal
fracture fragments. No dislocation at the glenohumeral joint. Soft
tissues are unremarkable.

Right elbow demonstrates no additional fracture or dislocation.
Nonstandard positioning on the lateral for occludes evaluation for
effusion.
IMPRESSION: Acute posttraumatic comminuted fractures of the midshaft right
humerus. No additional fracture seen in the right elbow.

## 2020-02-03 IMAGING — RF DG C-ARM 61-120 MIN
1 series · 4 of 4 positions shown · non-contrast
Comparison: 02/02/2019

CLINICAL DATA: ORIF right humeral fracture

EXAM:
RIGHT HUMERUS - 2+ VIEW; DG C-ARM 61-120 MIN

[Series 1: run · 4 of 4 slices shown]
[im 1/4]
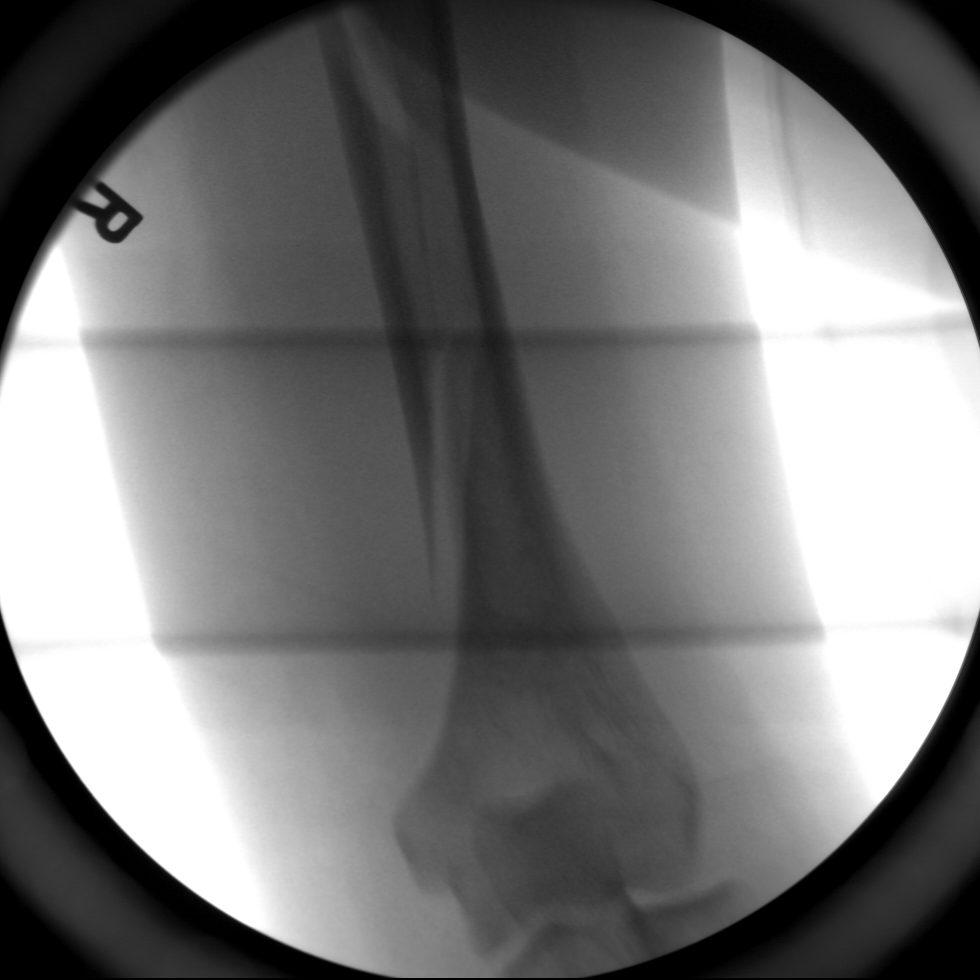
[im 2/4]
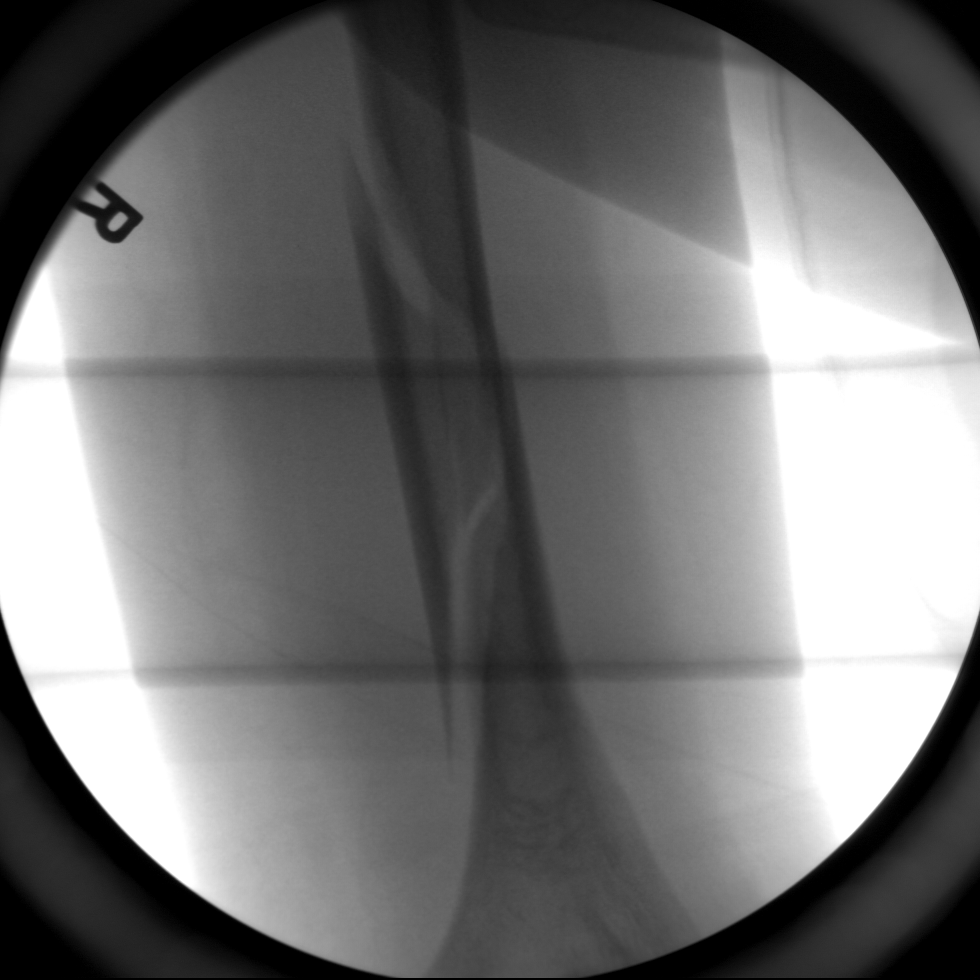
[im 3/4]
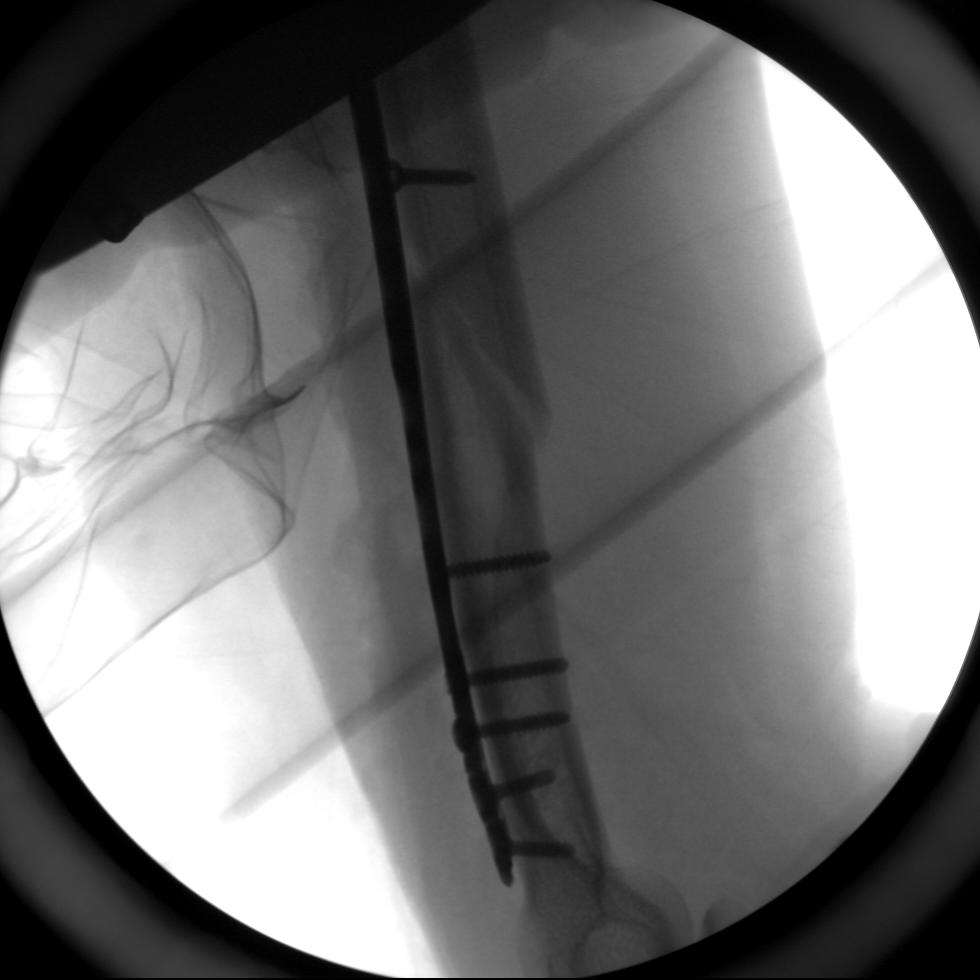
[im 4/4]
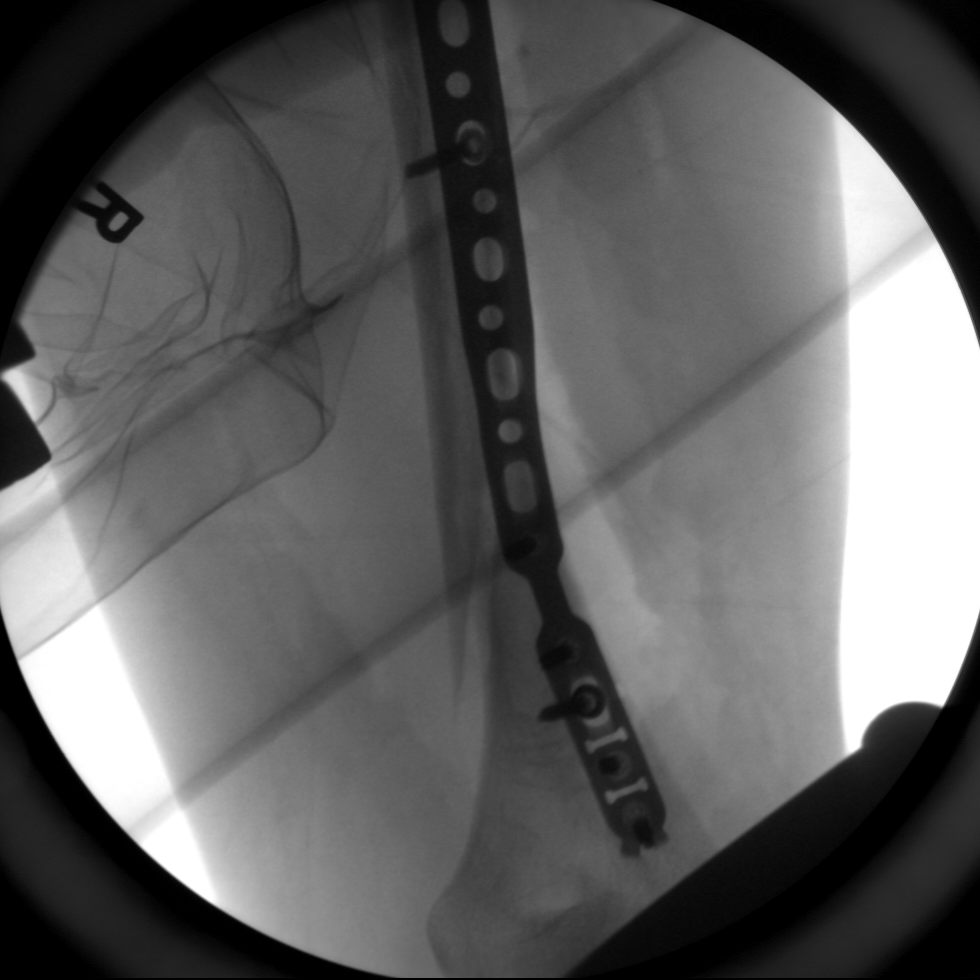

[4 of 4 positions shown; findings below may reference images not displayed]

FINDINGS: Multiple intraoperative spot images demonstrate plate and screw
fixation across the right humeral fracture. No hardware complicating
feature. Near anatomic alignment.
IMPRESSION: Internal fixation across the right humeral fracture without visible
complicating feature.

## 2020-10-30 ENCOUNTER — Other Ambulatory Visit: Payer: Self-pay

## 2020-10-30 ENCOUNTER — Emergency Department (HOSPITAL_BASED_OUTPATIENT_CLINIC_OR_DEPARTMENT_OTHER)
Admission: EM | Admit: 2020-10-30 | Discharge: 2020-10-30 | Disposition: A | Discharge status: Home / Self Care | Payer: Self-pay | Attending: Emergency Medicine | Admitting: Emergency Medicine

## 2020-10-30 ENCOUNTER — Emergency Department (HOSPITAL_BASED_OUTPATIENT_CLINIC_OR_DEPARTMENT_OTHER): Payer: Self-pay

## 2020-10-30 ENCOUNTER — Encounter (HOSPITAL_BASED_OUTPATIENT_CLINIC_OR_DEPARTMENT_OTHER): Payer: Self-pay

## 2020-10-30 DIAGNOSIS — R079 Chest pain, unspecified: Secondary | ICD-10-CM | POA: Insufficient documentation

## 2020-10-30 DIAGNOSIS — Z87891 Personal history of nicotine dependence: Secondary | ICD-10-CM | POA: Insufficient documentation

## 2020-10-30 DIAGNOSIS — I10 Essential (primary) hypertension: Secondary | ICD-10-CM | POA: Insufficient documentation

## 2020-10-30 DIAGNOSIS — J45909 Unspecified asthma, uncomplicated: Secondary | ICD-10-CM | POA: Insufficient documentation

## 2020-10-30 LAB — TROPONIN I (HIGH SENSITIVITY)
Troponin I (High Sensitivity): <2 ng/L (ref <18)
Troponin I (High Sensitivity): <2 ng/L (ref <18)

## 2020-10-30 LAB — CBC
HCT: 40.4 % (ref 36.0–46.0)
Hemoglobin: 13.1 g/dL (ref 12.0–15.0)
MCH: 30.6 pg (ref 26.0–34.0)
MCHC: 32.4 g/dL (ref 30.0–36.0)
MCV: 94.4 fL (ref 80.0–100.0)
Platelets: 273 10*3/uL (ref 150–400)
RBC: 4.28 MIL/uL (ref 3.87–5.11)
RDW: 13.1 % (ref 11.5–15.5)
WBC: 5.8 10*3/uL (ref 4.0–10.5)
nRBC: 0 % (ref 0.0–0.2)

## 2020-10-30 LAB — BASIC METABOLIC PANEL
Anion gap: 8 (ref 5–15)
BUN: 13 mg/dL (ref 6–20)
CO2: 24 mmol/L (ref 22–32)
Calcium: 9.2 mg/dL (ref 8.9–10.3)
Chloride: 103 mmol/L (ref 98–111)
Creatinine, Ser: 0.83 mg/dL (ref 0.44–1.00)
GFR, Estimated: >60 mL/min (ref >60)
Glucose, Bld: 100 mg/dL — ABNORMAL HIGH (ref 70–99)
Potassium: 4.4 mmol/L (ref 3.5–5.1)
Sodium: 135 mmol/L (ref 135–145)

## 2020-10-30 LAB — URINALYSIS, MICROSCOPIC (REFLEX)

## 2020-10-30 LAB — D-DIMER, QUANTITATIVE: D-Dimer, Quant: 0.3 ug/mL-FEU (ref 0.00–0.50)

## 2020-10-30 LAB — URINALYSIS, ROUTINE W REFLEX MICROSCOPIC
Bilirubin Urine: NEGATIVE
Glucose, UA: NEGATIVE mg/dL
Ketones, ur: NEGATIVE mg/dL
Nitrite: NEGATIVE
Protein, ur: NEGATIVE mg/dL
Specific Gravity, Urine: 1.01 (ref 1.005–1.030)
pH: 6 (ref 5.0–8.0)

## 2020-10-30 LAB — PREGNANCY, URINE: Preg Test, Ur: NEGATIVE

## 2020-10-30 MED ORDER — MORPHINE SULFATE (PF) 4 MG/ML IV SOLN
4.0000 mg | Freq: Once | INTRAVENOUS | Status: AC
  Administered 2020-10-30: 4 mg via INTRAVENOUS
  Filled 2020-10-30: qty 1

## 2020-10-30 MED ORDER — KETOROLAC TROMETHAMINE 30 MG/ML IJ SOLN
30.0000 mg | Freq: Once | INTRAMUSCULAR | Status: AC
  Administered 2020-10-30: 30 mg via INTRAVENOUS
  Filled 2020-10-30: qty 1

## 2020-10-30 MED ORDER — ASPIRIN EC 81 MG PO TBEC
243.0000 mg | DELAYED_RELEASE_TABLET | Freq: Once | ORAL | Status: AC
  Administered 2020-10-30: 243 mg via ORAL
  Filled 2020-10-30: qty 3

## 2020-10-30 NOTE — ED Triage Notes (Signed)
Pt arrives with c/o left sided chest pain that started last weekend but has gotten worse over the last day. Pt also states that she took 81 mg aspirin this morning around 6 am.

## 2020-10-30 NOTE — Discharge Instructions (Signed)
You were seen in the emergency department for chest pain rating into your shoulder and arm.  You had an EKG chest x-ray and blood work that did not show any serious findings.  It'll be important that you get a primary care doctor for further work-up of this.  We are also putting in a referral to cardiology.  Your symptoms seem to be improved with some anti-inflammatories so you should continue some Aleve or ibuprofen.  Return to the emergency department for any worsening or concerning symptoms

## 2020-10-30 NOTE — ED Provider Notes (Signed)
[facility] EMERGENCY DEPARTMENT Provider Note   CSN: [id number] Arrival date & time: 10/30/20  8101     History Chief Complaint  Patient presents with  . Chest Pain    Julie Lyons is a 39 y.o. female.  She has a history of asthma, HTN and GERD.  She is complained of chest pain left-sided that started a week ago.  Radiated into left shoulder and left arm.  She said it was mild during that time but she woke up this morning it was severe.  Pressure.  Associated with shortness of breath.  She tried a baby aspirin without improvement in her symptoms and drove herself here for evaluation.  Former smoker, denies cocaine.  No prior cardiac history.  The history is provided by the patient.  Chest Pain Pain location:  Substernal area and L chest Pain quality: pressure   Pain radiates to:  L shoulder and L arm Pain severity:  Severe Onset quality:  Gradual Duration:  1 week Timing:  Constant Progression:  Worsening Chronicity:  New Relieved by:  Nothing Worsened by:  Deep breathing and movement Ineffective treatments:  Aspirin Associated symptoms: shortness of breath   Associated symptoms: no abdominal pain, no back pain, no cough, no diaphoresis, no dizziness, no fever, no headache, no heartburn, no lower extremity edema, no nausea, no numbness, no syncope and no vomiting   Risk factors: hypertension     HPI: A 39 year old patient with a history of hypertension and obesity presents for evaluation of chest pain. Initial onset of pain was more than 6 hours ago. The patient's chest pain is described as heaviness/pressure/tightness and is not worse with exertion. The patient's chest pain is middle- or left-sided, is not well-localized, is not sharp and does radiate to the arms/jaw/neck. The patient does not complain of nausea and denies diaphoresis. The patient has no history of stroke, has no history of peripheral artery disease, has not smoked in the past 90 days, denies any  history of treated diabetes, has no relevant family history of coronary artery disease (first degree relative at less than age 71) and has no history of hypercholesterolemia.   Past Medical History:  Diagnosis Date  . Asthma    albuterol inhaler-not used for several months  . GERD (gastroesophageal reflux disease)    occas- no meds  . Headache(784.0)   . History of kidney stones   . Hypertension   . Ovarian cyst     Patient Active Problem List   Diagnosis Date Noted  . Closed comminuted right humeral fracture 02/05/2019  . 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 Proteinuria complicating pregnancy 01/09/2012  . Genital herpes 12/26/2011  . Supervision of high-risk pregnancy 12/26/2011  . Cervical incompetence with SAB at 16 weeks (silent dilation and delivery) 11/07/2011  . UTI in pregnancy, antepartum 11/07/2011  . Benign essential hypertension antepartum 11/07/2011  . Asthma 11/07/2011    Past Surgical History:  Procedure Laterality Date  . CERVICAL CERCLAGE  12/02/2011   Procedure: CERCLAGE CERVICAL;  Surgeon: Tereso Newcomer, MD;  Location: [facility];  Service: Gynecology;  Laterality: N/A;  . CERVICAL CERCLAGE  05/15/2012   Procedure: CERCLAGE CERVICAL;  Surgeon: Adam Phenix, MD;  Location: [facility];  Service: Gynecology;  Laterality: N/A;  removal of cerclage from cervix  . OOPHORECTOMY     left ovary and tube  . ORIF HUMERUS FRACTURE Right 02/05/2019   Procedure: OPEN REDUCTION INTERNAL FIXATION (ORIF)  RIGHT HUMERAL SHAFT FRACTURE;  Surgeon: Gershon Mussel  M, MD;  Location: [facility];  Service: Orthopedics;  Laterality: Right;  . TONSILLECTOMY    . TUBAL LIGATION  08/18/2012   Procedure: ESSURE TUBAL STERILIZATION;  Surgeon: Willodean Rosenthalarolyn Harraway-Smith, MD;  Location: [facility];  Service: Gynecology;  Laterality: Left;  Marland Kitchen. VAGINAL DELIVERY  1997, 2000. 2004     OB History    Gravida  6   Para  4   Term  4   Preterm      AB  2   Living  4     SAB  1   TAB  1    Ectopic      Multiple      Live Births  3           Family History  Problem Relation Age of Onset  . Hypertension Father   . Anesthesia problems Mother     Social History   Tobacco Use  . Smoking status: Former Smoker    Packs/day: 0.25    Years: 10.00    Pack years: 2.50    Types: Cigarettes    Quit date: 10/16/2020    Years since quitting: 0.0  . Smokeless tobacco: Never Used  Substance Use Topics  . Alcohol use: Yes    Comment: occassionally  . Drug use: No    Home Medications Prior to Admission medications   Medication Sig Start Date End Date Taking? Authorizing Provider  ALBUTEROL IN Inhale 2 puffs into the lungs.    [provider]  calcium-vitamin D (OSCAL WITH D) 500-200 MG-UNIT tablet Take 1 tablet by mouth 3 (three) times daily. 02/05/19   Tarry KosXu, Naiping M, MD  methocarbamol (ROBAXIN) 750 MG tablet Take 1 tablet (750 mg total) by mouth 2 (two) times daily as needed for muscle spasms. 02/05/19   Tarry KosXu, Naiping M, MD  ondansetron (ZOFRAN) 4 MG tablet Take 1-2 tablets (4-8 mg total) by mouth every 8 (eight) hours as needed for nausea or vomiting. 02/05/19   Tarry KosXu, Naiping M, MD  oxyCODONE-acetaminophen (PERCOCET) 5-325 MG tablet Take 1-2 tablets by mouth 3 (three) times daily as needed for severe pain. 02/12/19   Cristie HemStanbery, Mary L, PA-C  promethazine (PHENERGAN) 25 MG tablet Take 1 tablet (25 mg total) by mouth every 6 (six) hours as needed for nausea. 02/05/19   Tarry KosXu, Naiping M, MD  zinc sulfate 220 (50 Zn) MG capsule Take 1 capsule (220 mg total) by mouth daily. 02/05/19   Tarry KosXu, Naiping M, MD    Allergies    Patient has no known allergies.  Review of Systems   Review of Systems  Constitutional: Negative for diaphoresis and fever.  HENT: Negative for sore throat.   Eyes: Negative for visual disturbance.  Respiratory: Positive for shortness of breath. Negative for cough.   Cardiovascular: Positive for chest pain. Negative for syncope.  Gastrointestinal: Negative for  abdominal pain, heartburn, nausea and vomiting.  Genitourinary: Negative for dysuria.  Musculoskeletal: Negative for back pain.  Skin: Negative for rash.  Neurological: Negative for dizziness, numbness and headaches.    Physical Exam Updated Vital Signs BP (!) 190/121 (BP Location: Right Arm)   Pulse 79   Temp 98.9 F (37.2 C) (Oral)   Resp (!) 22   Ht 5\' 7"  (1.702 m)   Wt 106.6 kg   LMP 10/23/2020   SpO2 100%   BMI 36.81 kg/m   Physical Exam Vitals and nursing note reviewed.  Constitutional:      General: She is not  in acute distress.    Appearance: She is well-developed.  HENT:     Head: Normocephalic and atraumatic.  Eyes:     Conjunctiva/sclera: Conjunctivae normal.  Cardiovascular:     Rate and Rhythm: Normal rate and regular rhythm.     Heart sounds: Normal heart sounds. No murmur heard.   Pulmonary:     Effort: Pulmonary effort is normal. No respiratory distress.     Breath sounds: Normal breath sounds.  Chest:     Chest wall: Tenderness present. No crepitus.  Abdominal:     Palpations: Abdomen is soft. There is no mass.     Tenderness: There is no abdominal tenderness.  Musculoskeletal:     Cervical back: Neck supple.     Right lower leg: No tenderness. No edema.     Left lower leg: No tenderness. No edema.  Skin:    General: Skin is warm and dry.     Capillary Refill: Capillary refill takes less than 2 seconds.  Neurological:     General: No focal deficit present.     Mental Status: She is alert.     ED Results / Procedures / Treatments   Labs (all labs ordered are listed, but only abnormal results are displayed) Labs Reviewed  BASIC METABOLIC PANEL - Abnormal; Notable for the following components:      Result Value   Glucose, Bld 100 (*)    All other components within normal limits  URINALYSIS, ROUTINE W REFLEX MICROSCOPIC - Abnormal; Notable for the following components:   Hgb urine dipstick LARGE (*)    Leukocytes,Ua TRACE (*)    All other  components within normal limits  URINALYSIS, MICROSCOPIC (REFLEX) - Abnormal; Notable for the following components:   Bacteria, UA MANY (*)    All other components within normal limits  CBC  D-DIMER, QUANTITATIVE (NOT AT [facility])  PREGNANCY, URINE  TROPONIN I (HIGH SENSITIVITY)  TROPONIN I (HIGH SENSITIVITY)    EKG EKG Interpretation  Date/Time:  Monday October 30 2020 07:44:01 EDT Ventricular Rate:  86 PR Interval:    QRS Duration: 78 QT Interval:  387 QTC Calculation: 463 R Axis:   45 Text Interpretation: Sinus rhythm Borderline T abnormalities, inferior leads No old tracing to compare Confirmed by Meridee Score [phone]) on 10/30/2020 7:49:32 AM   Radiology DG Chest 2 View  Result Date: 10/30/2020 CLINICAL DATA:  Chest pain. EXAM: CHEST - 2 VIEW COMPARISON:  None. FINDINGS: The heart size and mediastinal contours are within normal limits. Both lungs are clear. No pneumothorax or pleural effusion is noted. The visualized skeletal structures are unremarkable. IMPRESSION: No active cardiopulmonary disease. Electronically Signed   By: Lupita Raider M.D.   On: 10/30/2020 08:02    Procedures Procedures (including critical care time)  Medications Ordered in ED Medications  aspirin EC tablet 243 mg (243 mg Oral Given 10/30/20 0804)  morphine 4 MG/ML injection 4 mg (4 mg Intravenous Given 10/30/20 0806)  ketorolac (TORADOL) 30 MG/ML injection 30 mg (30 mg Intravenous Given 10/30/20 1046)    ED Course  I have reviewed the triage vital signs and the nursing notes.  Pertinent labs & imaging results that were available during my care of the patient were reviewed by me and considered in my medical decision making (see chart for details).  Clinical Course as of Oct 31 1743  [facility] Oct 30, 2020  8937 Chest x-ray interpreted by me as no pneumothorax no infiltrates.   [MB]  1023 Patient states  her pain is somewhat improved.  I reviewed her work-up currently and the fact that she still  needs another troponin.  Her blood pressures come down somewhat.  Have ordered her some Toradol.   [MB]  1117 Patient states her pain is improving after Toradol.  She doesn't have a primary care doctor so we'll put a consult in for cardiology.  Return instructions discussed.   [MB]    Clinical Course User Index [MB] Terrilee Files, MD   MDM Rules/Calculators/A&P HEAR Score: 3                       This patient complains of central and left-sided chest pain radiating into left shoulder left arm; this involves an extensive number of treatment Options and is a complaint that carries with it a high risk of complications and Morbidity. The differential includes ACS, musculoskeletal, pneumothorax, PE, vascular, reflux  I ordered, reviewed and interpreted labs, which included CBC with normal white count normal hemoglobin, chemistries normal, troponins flat, urinalysis possibly infected although patient has no symptoms, pregnancy test negative, D-dimer negative I ordered medication IV pain medicine, IV Toradol with improvement in her symptoms I ordered imaging studies which included chest x-ray and I independently    visualized and interpreted imaging which showed no acute pulmonary disease Previous records obtained and reviewed in epic, no recent admissions  After the interventions stated above, I reevaluated the patient and found patient symptoms to be improved, mostly from the Toradol.  Reviewed with patient and she is comfortable plan for outpatient work-up.  She does not have a PCP so we will put a referral into cardiology.  Return instructions discussed.   Final Clinical Impression(s) / ED Diagnoses Final diagnoses:  Nonspecific chest pain    Rx / DC Orders ED Discharge Orders    None       Terrilee Files, MD 10/30/20 1747

## 2020-10-30 NOTE — ED Notes (Signed)
Patient transported to XRAY 

## 2020-10-30 NOTE — ED Notes (Signed)
Pt self ambulated to restroom without difficulty. 

## 2020-10-31 DIAGNOSIS — N83209 Unspecified ovarian cyst, unspecified side: Secondary | ICD-10-CM | POA: Insufficient documentation

## 2020-10-31 DIAGNOSIS — I10 Essential (primary) hypertension: Secondary | ICD-10-CM | POA: Insufficient documentation

## 2020-10-31 DIAGNOSIS — Z87442 Personal history of urinary calculi: Secondary | ICD-10-CM | POA: Insufficient documentation

## 2020-10-31 DIAGNOSIS — K219 Gastro-esophageal reflux disease without esophagitis: Secondary | ICD-10-CM | POA: Insufficient documentation

## 2020-11-01 ENCOUNTER — Ambulatory Visit (INDEPENDENT_AMBULATORY_CARE_PROVIDER_SITE_OTHER): Payer: Self-pay | Admitting: Cardiology

## 2020-11-01 ENCOUNTER — Encounter: Payer: Self-pay | Admitting: Cardiology

## 2020-11-01 ENCOUNTER — Other Ambulatory Visit: Payer: Self-pay

## 2020-11-01 VITALS — BP 159/102 | HR 68 | Ht 67.0 in | Wt 228.0 lb

## 2020-11-01 DIAGNOSIS — I10 Essential (primary) hypertension: Secondary | ICD-10-CM

## 2020-11-01 DIAGNOSIS — R0789 Other chest pain: Secondary | ICD-10-CM

## 2020-11-01 DIAGNOSIS — R011 Cardiac murmur, unspecified: Secondary | ICD-10-CM

## 2020-11-01 DIAGNOSIS — F17210 Nicotine dependence, cigarettes, uncomplicated: Secondary | ICD-10-CM

## 2020-11-01 DIAGNOSIS — E669 Obesity, unspecified: Secondary | ICD-10-CM

## 2020-11-01 DIAGNOSIS — R072 Precordial pain: Secondary | ICD-10-CM

## 2020-11-01 HISTORY — DX: Other chest pain: R07.89

## 2020-11-01 HISTORY — DX: Nicotine dependence, cigarettes, uncomplicated: F17.210

## 2020-11-01 HISTORY — DX: Obesity, unspecified: E66.9

## 2020-11-01 HISTORY — DX: Essential (primary) hypertension: I10

## 2020-11-01 MED ORDER — ALBUTEROL SULFATE HFA 108 (90 BASE) MCG/ACT IN AERS: 2.0000 | INHALATION_SPRAY | Freq: Four times a day (QID) | RESPIRATORY_TRACT | 0 refills | Status: AC | PRN

## 2020-11-01 MED ORDER — AMLODIPINE BESYLATE 10 MG PO TABS: 5.0000 mg | ORAL_TABLET | Freq: Every day | ORAL | 3 refills | Status: DC

## 2020-11-01 NOTE — Progress Notes (Signed)
Cardiology Office Note:    Date:  11/01/2020   ID:  Julie Lyons, DOB March 11, 1981, MRN [id number]  PCP:  Patient, No Pcp Per  Cardiologist:  Julie Brothers, MD   Referring MD: Julie Files, MD    ASSESSMENT:    1. Primary hypertension   2. Essential hypertension   3. Obesity (BMI 35.0-39.9 without comorbidity)   4. Precordial pain   5. Murmur, cardiac   6. Cigarette smoker    PLAN:    In order of problems listed above:  1. Primary prevention stressed with the patient.  Importance of compliance with diet medication stressed and vocalized understanding. 2. Chest discomfort: She has multiple risk factors for coronary artery disease so we will do a Lexiscan sestamibi.  Procedure, benefits and potential is explained and she vocalized understanding. 3. Cardiac murmur: Echocardiogram will be done to assess murmur on auscultation. 4. Essential hypertension: Blood pressure is elevated and I started on amlodipine 10 mg half tablet daily.  She will keep a track of her blood pressures and also salt intake issues and diet was discussed. 5. Obesity: Weight reduction was stressed and risks of obesity explained and she promises to comply. 6. Cigarette smoker: I spent 5 minutes with the patient discussing solely about smoking. Smoking cessation was counseled. I suggested to the patient also different medications and pharmacological interventions. Patient is keen to try stopping on its own at this time. He will get back to me if he needs any further assistance in this matter. 7. Patient will be seen in follow-up appointment in 4 weeks or earlier if the patient has any concerns    Medication Adjustments/Labs and Tests Ordered: Current medicines are reviewed at length with the patient today.  Concerns regarding medicines are outlined above.  Orders Placed This Encounter  Procedures  . Hepatic function panel  . Lipid panel  . TSH  . VITAMIN D 25 Hydroxy (Vit-D Deficiency, Fractures)  .  MYOCARDIAL PERFUSION IMAGING  . EKG 12-Lead  . ECHOCARDIOGRAM COMPLETE   Meds ordered this encounter  Medications  . amLODipine (NORVASC) 10 MG tablet    Sig: Take 0.5 tablets (5 mg total) by mouth daily.    Dispense:  45 tablet    Refill:  3     History of Present Illness:    Julie Lyons is a 39 y.o. female who is being seen today for the evaluation of chest discomfort at the request of Julie Files, MD.  Patient is a pleasant 39 year old female.  She has past medical history of essential hypertension which has not been very careful with taking care of herself.  She is not taken any medical therapy for it.  She denies any chest pain orthopnea or PND.  She is a cigarette smoker and she says she stopped smoking about 2 weeks ago and she is considering vaping.  She went to the emergency room with chest pain.  This does not occur on exertion.  No radiation to the neck or to the arms.  She is active lady but does not exercise on a regular basis.  At the time of my evaluation, the patient is alert awake oriented and in no distress.  Past Medical History:  Diagnosis Date  . Asthma    albuterol inhaler-not used for several months  . Asthma 11/07/2011  . Benign essential hypertension antepartum 11/07/2011   Baseline labs normal;  24 hour urine 273 mg. Repeat 1/24 156mg  Start 2X/week testing at 32 weeks   .  Cervical incompetence with SAB at 16 weeks (silent dilation and delivery) 11/07/2011   S/p Cerclage @ 12 wks   . Closed comminuted right humeral fracture 02/05/2019  . Genital herpes 12/26/2011  . GERD (gastroesophageal reflux disease)    occas- no meds  . Headache(784.0)   . History of kidney stones   . 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 Hypertension   . Ovarian cyst   . Proteinuria complicating pregnancy 01/09/2012   Had Citrobacter with 4+ protein, completed abx, now with 2+ again--repeat culture and if present again, needs repeat 24 hour urine--156mg /24 hours.   . Supervision of  high-risk pregnancy 12/26/2011   Normal first screen and NT.  AFP negative 12/26/11 - see media tab Early1 hour gtt = 81, second 1 hr 112, nl anatomy  Plans BTL-has had one tube and ovary removed-right side-records reviewed-will try to obtain records from [facility]   . UTI in pregnancy, antepartum 11/07/2011   Citrobacter-treated with Bactrim--on prophylaxis     Past Surgical History:  Procedure Laterality Date  . CERVICAL CERCLAGE  12/02/2011   Procedure: CERCLAGE CERVICAL;  Surgeon: Tereso Newcomer, MD;  Location: [facility];  Service: Gynecology;  Laterality: N/A;  . CERVICAL CERCLAGE  05/15/2012   Procedure: CERCLAGE CERVICAL;  Surgeon: Adam Phenix, MD;  Location: [facility];  Service: Gynecology;  Laterality: N/A;  removal of cerclage from cervix  . OOPHORECTOMY     left ovary and tube  . ORIF HUMERUS FRACTURE Right 02/05/2019   Procedure: OPEN REDUCTION INTERNAL FIXATION (ORIF)  RIGHT HUMERAL SHAFT FRACTURE;  Surgeon: Tarry Kos, MD;  Location: [facility];  Service: Orthopedics;  Laterality: Right;  . TONSILLECTOMY    . TUBAL LIGATION  08/18/2012   Procedure: ESSURE TUBAL STERILIZATION;  Surgeon: Willodean Rosenthal, MD;  Location: [facility];  Service: Gynecology;  Laterality: Left;  Marland Kitchen VAGINAL DELIVERY  1997, 2000. 2004    Current Medications: Current Meds  Medication Sig  . ALBUTEROL IN Inhale 2 puffs into the lungs as needed.      Allergies:   Patient has no known allergies.   Social History   Socioeconomic History  . Marital status: Single    Spouse name: Not on file  . Number of children: Not on file  . Years of education: Not on file  . Highest education level: Not on file  Occupational History  . Not on file  Tobacco Use  . Smoking status: Former Smoker    Packs/day: 0.25    Years: 10.00    Pack years: 2.50    Types: Cigarettes    Quit date: 10/16/2020    Years since quitting: 0.0  . Smokeless tobacco: Never Used  Substance and Sexual Activity  . Alcohol  use: Yes    Comment: occassionally  . Drug use: No  . Sexual activity: Yes  Other Topics Concern  . Not on file  Social History Narrative  . Not on file   Social Determinants of Health   Financial Resource Strain:   . Difficulty of Paying Living Expenses: Not on file  Food Insecurity:   . Worried About Programme researcher, broadcasting/film/video in the Last Year: Not on file  . Ran Out of Food in the Last Year: Not on file  Transportation Needs:   . Lack of Transportation (Medical): Not on file  . Lack of Transportation (Non-Medical): Not on file  Physical Activity:   . Days of Exercise per Week: Not on file  . Minutes  of Exercise per Session: Not on file  Stress:   . Feeling of Stress : Not on file  Social Connections:   . Frequency of Communication with Friends and Family: Not on file  . Frequency of Social Gatherings with Friends and Family: Not on file  . Attends Religious Services: Not on file  . Active Member of Clubs or Organizations: Not on file  . Attends Banker Meetings: Not on file  . Marital Status: Not on file     Family History: The patient's family history includes Anesthesia problems in her mother; Hypertension in her father.  ROS:   Please see the history of present illness.    All other systems reviewed and are negative.  EKGs/Labs/Other Studies Reviewed:    The following studies were reviewed today: EKG reveals sinus rhythm and nonspecific ST changes.   Recent Labs: 10/30/2020: BUN 13; Creatinine, Ser 0.83; Hemoglobin 13.1; Platelets 273; Potassium 4.4; Sodium 135  Recent Lipid Panel No results found for: CHOL, TRIG, HDL, CHOLHDL, VLDL, LDLCALC, LDLDIRECT  Physical Exam:    VS:  BP (!) 159/102   Pulse 68   Ht 5\' 7"  (1.702 m)   Wt 228 lb 0.6 oz (103.4 kg)   LMP 10/23/2020   SpO2 97%   BMI 35.72 kg/m     Wt Readings from Last 3 Encounters:  11/01/20 228 lb 0.6 oz (103.4 kg)  10/30/20 235 lb (106.6 kg)  03/05/19 223 lb (101.2 kg)     GEN:  Patient is in no acute distress HEENT: Normal NECK: No JVD; No carotid bruits LYMPHATICS: No lymphadenopathy CARDIAC: S1 S2 regular, 2/6 systolic murmur at the apex. RESPIRATORY:  Clear to auscultation without rales, wheezing or rhonchi  ABDOMEN: Soft, non-tender, non-distended MUSCULOSKELETAL:  No edema; No deformity  SKIN: Warm and dry NEUROLOGIC:  Alert and oriented x 3 PSYCHIATRIC:  Normal affect    Signed, 05/05/19, MD  11/01/2020 4:43 PM    [facility]

## 2020-11-01 NOTE — Patient Instructions (Addendum)
Medication Instructions:  Your physician has recommended you make the following change in your medication:   Start Amlodipine 10 mg take 1/2 tablet (5 mg) daily.  *If you need a refill on your cardiac medications before your next appointment, please call your pharmacy*   Lab Work: Your physician recommends that you return for lab work in: the next few days. You need to have labs done when you are fasting.  You can come Monday through Friday 8:30 am to 12:00 pm and 1:15 to 4:30. You do not need to make an appointment as the order has already been placed. The labs you are going to have done are TSH, LFT, vitamin D and Lipids. '  If you have labs (blood work) drawn today and your tests are completely normal, you will receive your results only by: Marland Kitchen. MyChart Message (if you have MyChart) OR . A paper copy in the mail If you have any lab test that is abnormal or we need to change your treatment, we will call you to review the results.   Testing/Procedures: Your physician has requested that you have an echocardiogram. Echocardiography is a painless test that uses sound waves to create images of your heart. It provides your doctor with information about the size and shape of your heart and how well your heart's chambers and valves are working. This procedure takes approximately one hour. There are no restrictions for this procedure.  Your physician has requested that you have a lexiscan myoview. For further information please visit [email or web address]. Please follow instruction sheet, as given.  The test will take approximately 3 to 4 hours to complete; you may bring reading material.  If someone comes with you to your appointment, they will need to remain in the main lobby due to limited space in the testing area. **If you are pregnant or breastfeeding, please notify the nuclear lab prior to your appointment**  How to prepare for your Myocardial Perfusion Test: . Do not eat or drink 3 hours prior  to your test, except you may have water. . Do not consume products containing caffeine (regular or decaffeinated) 12 hours prior to your test. (ex: coffee, chocolate, sodas, tea). . Do bring a list of your current medications with you.  If not listed below, you may take your medications as normal. . Do wear comfortable clothes (no dresses or overalls) and walking shoes, tennis shoes preferred (No heels or open toe shoes are allowed). . Do NOT wear cologne, perfume, aftershave, or lotions (deodorant is allowed). . If these instructions are not followed, your test will have to be rescheduled.    Follow-Up: At [facility], you and your health needs are our priority.  As part of our continuing mission to provide you with exceptional heart care, we have created designated Provider Care Teams.  These Care Teams include your primary Cardiologist (physician) and Advanced Practice Providers (APPs -  Physician Assistants and Nurse Practitioners) who all work together to provide you with the care you need, when you need it.  We recommend signing up for the patient portal called "MyChart".  Sign up information is provided on this After Visit Summary.  MyChart is used to connect with patients for Virtual Visits (Telemedicine).  Patients are able to view lab/test results, encounter notes, upcoming appointments, etc.  Non-urgent messages can be sent to your provider as well.   To learn more about what you can do with MyChart, go to [email or web address].    Your next appointment:  1 month(s)  The format for your next appointment:   In Person  Provider:   Belva Crome, MD   Other Instructions  Cardiac Nuclear Scan  A cardiac nuclear scan is a test that is done to check the flow of blood to your heart. It is done when you are resting and when you are exercising. The test looks for problems such as:  Not enough blood reaching a portion of the heart.  The heart muscle not working as it  should. You may need this test if:  You have heart disease.  You have had lab results that are not normal.  You have had heart surgery or a balloon procedure to open up blocked arteries (angioplasty).  You have chest pain.  You have shortness of breath. In this test, a special dye (tracer) is put into your bloodstream. The tracer will travel to your heart. A camera will then take pictures of your heart to see how the tracer moves through your heart. This test is usually done at a hospital and takes 2-4 hours. Tell a doctor about:  Any allergies you have.  All medicines you are taking, including vitamins, herbs, eye drops, creams, and over-the-counter medicines.  Any problems you or family members have had with anesthetic medicines.  Any blood disorders you have.  Any surgeries you have had.  Any medical conditions you have.  Whether you are pregnant or may be pregnant. What are the risks? Generally, this is a safe test. However, problems may occur, such as:  Serious chest pain and heart attack. This is only a risk if the stress portion of the test is done.  Rapid heartbeat.  A feeling of warmth in your chest. This feeling usually does not last long.  Allergic reaction to the tracer. What happens before the test?  Ask your doctor about changing or stopping your normal medicines. This is important.  Follow instructions from your doctor about what you cannot eat or drink.  Remove your jewelry on the day of the test. What happens during the test? 1. An IV tube will be inserted into one of your veins. 2. Your doctor will give you a small amount of tracer through the IV tube. 3. You will wait for 20-40 minutes while the tracer moves through your bloodstream. 4. Your heart will be monitored with an electrocardiogram (ECG). 5. You will lie down on an exam table. 6. Pictures of your heart will be taken for about 15-20 minutes. 7. You may also have a stress test. For this  test, one of these things may be done: ? You will be asked to exercise on a treadmill or a stationary bike. ? You will be given medicines that will make your heart work harder. This is done if you are unable to exercise. 8. When blood flow to your heart has peaked, a tracer will again be given through the IV tube. 9. After 20-40 minutes, you will get back on the exam table. More pictures will be taken of your heart. 10. Depending on the tracer that is used, more pictures may need to be taken 3-4 hours later. 11. Your IV tube will be removed when the test is over. The test may vary among doctors and hospitals. What happens after the test? 1. Ask your doctor: ? Whether you can return to your normal schedule, including diet, activities, and medicines. ? Whether you should drink more fluids. This will help to remove the tracer from your body. Drink  enough fluid to keep your pee (urine) pale yellow. 2. Ask your doctor, or the department that is doing the test: ? When will my results be ready? ? How will I get my results? Summary  A cardiac nuclear scan is a test that is done to check the flow of blood to your heart.  Tell your doctor whether you are pregnant or may be pregnant.  Before the test, ask your doctor about changing or stopping your normal medicines. This is important.  Ask your doctor whether you can return to your normal activities. You may be asked to drink more fluids. This information is not intended to replace advice given to you by your health care provider. Make sure you discuss any questions you have with your health care provider. Document Revised: 04/07/2019 Document Reviewed: 06/01/2018 Elsevier Patient Education  2020 [employer].  Echocardiogram An echocardiogram is a procedure that uses painless sound waves (ultrasound) to produce an image of the heart. Images from an echocardiogram can provide important information about:  Signs of coronary artery disease  (CAD).  Aneurysm detection. An aneurysm is a weak or damaged part of an artery wall that bulges out from the normal force of blood pumping through the body.  Heart size and shape. Changes in the size or shape of the heart can be associated with certain conditions, including heart failure, aneurysm, and CAD.  Heart muscle function.  Heart valve function.  Signs of a past heart attack.  Fluid buildup around the heart.  Thickening of the heart muscle.  A tumor or infectious growth around the heart valves. Tell a health care provider about:  Any allergies you have.  All medicines you are taking, including vitamins, herbs, eye drops, creams, and over-the-counter medicines.  Any blood disorders you have.  Any surgeries you have had.  Any medical conditions you have.  Whether you are pregnant or may be pregnant. What are the risks? Generally, this is a safe procedure. However, problems may occur, including:  Allergic reaction to dye (contrast) that may be used during the procedure. What happens before the procedure? No specific preparation is needed. You may eat and drink normally. What happens during the procedure?    An IV tube may be inserted into one of your veins.  You may receive contrast through this tube. A contrast is an injection that improves the quality of the pictures from your heart.  A gel will be applied to your chest.  A wand-like tool (transducer) will be moved over your chest. The gel will help to transmit the sound waves from the transducer.  The sound waves will harmlessly bounce off of your heart to allow the heart images to be captured in real-time motion. The images will be recorded on a computer. The procedure may vary among health care providers and hospitals. What happens after the procedure?  You may return to your normal, everyday life, including diet, activities, and medicines, unless your health care provider tells you not to do  that. Summary  An echocardiogram is a procedure that uses painless sound waves (ultrasound) to produce an image of the heart.  Images from an echocardiogram can provide important information about the size and shape of your heart, heart muscle function, heart valve function, and fluid buildup around your heart.  You do not need to do anything to prepare before this procedure. You may eat and drink normally.  After the echocardiogram is completed, you may return to your normal, everyday life, unless  your health care provider tells you not to do that. This information is not intended to replace advice given to you by your health care provider. Make sure you discuss any questions you have with your health care provider. Document Revised: 04/08/2019 Document Reviewed: 01/18/2017 Elsevier Patient Education  2020 [employer].

## 2020-11-01 NOTE — Addendum Note (Signed)
Addended by: Eleonore Chiquito on: 11/01/2020 05:00 PM   Modules accepted: Orders

## 2020-11-21 ENCOUNTER — Telehealth: Payer: Self-pay

## 2020-11-21 NOTE — Telephone Encounter (Signed)
Detailed instructions left on the patient's answering machine. Asked to call back with any questions. S.Inghram EMTP 

## 2020-11-27 ENCOUNTER — Telehealth (HOSPITAL_COMMUNITY): Payer: Self-pay

## 2020-11-27 NOTE — Telephone Encounter (Signed)
Spoke with the patient, she has requested to reschedule her appt for the end of January. She is waiting on medical insurance. S.Trinka EMTP

## 2020-11-28 ENCOUNTER — Other Ambulatory Visit (HOSPITAL_COMMUNITY): Payer: Self-pay

## 2020-11-28 ENCOUNTER — Encounter (HOSPITAL_COMMUNITY): Payer: Self-pay

## 2020-12-05 ENCOUNTER — Ambulatory Visit: Payer: Self-pay | Admitting: Cardiology

## 2020-12-25 NOTE — Addendum Note (Signed)
Addended by: Eleonore Chiquito on: 12/25/2020 10:58 AM   Modules accepted: Orders

## 2021-01-02 NOTE — Addendum Note (Signed)
Addended by: Belva Crome R on: 01/02/2021 01:55 PM   Modules accepted: Orders

## 2021-01-26 ENCOUNTER — Encounter (HOSPITAL_COMMUNITY): Payer: Self-pay

## 2021-01-26 ENCOUNTER — Other Ambulatory Visit (HOSPITAL_COMMUNITY): Payer: Self-pay

## 2021-02-09 ENCOUNTER — Other Ambulatory Visit (HOSPITAL_COMMUNITY): Payer: Self-pay

## 2021-02-09 ENCOUNTER — Encounter (HOSPITAL_COMMUNITY): Payer: Self-pay

## 2021-04-17 ENCOUNTER — Emergency Department (HOSPITAL_BASED_OUTPATIENT_CLINIC_OR_DEPARTMENT_OTHER): Payer: BC Managed Care – PPO

## 2021-04-17 ENCOUNTER — Encounter (HOSPITAL_BASED_OUTPATIENT_CLINIC_OR_DEPARTMENT_OTHER): Payer: Self-pay | Admitting: Emergency Medicine

## 2021-04-17 ENCOUNTER — Emergency Department (HOSPITAL_BASED_OUTPATIENT_CLINIC_OR_DEPARTMENT_OTHER)
Admission: EM | Admit: 2021-04-17 | Discharge: 2021-04-17 | Disposition: A | Discharge status: Home / Self Care | Payer: BC Managed Care – PPO | Attending: Emergency Medicine | Admitting: Emergency Medicine

## 2021-04-17 ENCOUNTER — Other Ambulatory Visit: Payer: Self-pay

## 2021-04-17 DIAGNOSIS — Z7982 Long term (current) use of aspirin: Secondary | ICD-10-CM | POA: Insufficient documentation

## 2021-04-17 DIAGNOSIS — M546 Pain in thoracic spine: Secondary | ICD-10-CM | POA: Insufficient documentation

## 2021-04-17 DIAGNOSIS — Z87891 Personal history of nicotine dependence: Secondary | ICD-10-CM | POA: Diagnosis not present

## 2021-04-17 DIAGNOSIS — M79602 Pain in left arm: Secondary | ICD-10-CM | POA: Diagnosis not present

## 2021-04-17 DIAGNOSIS — J45909 Unspecified asthma, uncomplicated: Secondary | ICD-10-CM | POA: Insufficient documentation

## 2021-04-17 DIAGNOSIS — I10 Essential (primary) hypertension: Secondary | ICD-10-CM | POA: Insufficient documentation

## 2021-04-17 DIAGNOSIS — R0789 Other chest pain: Secondary | ICD-10-CM | POA: Diagnosis not present

## 2021-04-17 DIAGNOSIS — R079 Chest pain, unspecified: Secondary | ICD-10-CM | POA: Diagnosis present

## 2021-04-17 LAB — CBC WITH DIFFERENTIAL/PLATELET
Abs Immature Granulocytes: 0.01 10*3/uL (ref 0.00–0.07)
Basophils Absolute: 0 10*3/uL (ref 0.0–0.1)
Basophils Relative: 1 %
Eosinophils Absolute: 0.3 10*3/uL (ref 0.0–0.5)
Eosinophils Relative: 5 %
HCT: 40.9 % (ref 36.0–46.0)
Hemoglobin: 13.6 g/dL (ref 12.0–15.0)
Immature Granulocytes: 0 %
Lymphocytes Relative: 33 %
Lymphs Abs: 1.7 10*3/uL (ref 0.7–4.0)
MCH: 30.9 pg (ref 26.0–34.0)
MCHC: 33.3 g/dL (ref 30.0–36.0)
MCV: 93 fL (ref 80.0–100.0)
Monocytes Absolute: 0.4 10*3/uL (ref 0.1–1.0)
Monocytes Relative: 8 %
Neutro Abs: 2.6 10*3/uL (ref 1.7–7.7)
Neutrophils Relative %: 53 %
Platelets: 347 10*3/uL (ref 150–400)
RBC: 4.4 MIL/uL (ref 3.87–5.11)
RDW: 13 % (ref 11.5–15.5)
WBC: 5 10*3/uL (ref 4.0–10.5)
nRBC: 0 % (ref 0.0–0.2)

## 2021-04-17 LAB — COMPREHENSIVE METABOLIC PANEL
ALT: 31 U/L (ref 0–44)
AST: 37 U/L (ref 15–41)
Albumin: 4 g/dL (ref 3.5–5.0)
Alkaline Phosphatase: 66 U/L (ref 38–126)
Anion gap: 9 (ref 5–15)
BUN: 18 mg/dL (ref 6–20)
CO2: 24 mmol/L (ref 22–32)
Calcium: 9.4 mg/dL (ref 8.9–10.3)
Chloride: 102 mmol/L (ref 98–111)
Creatinine, Ser: 0.71 mg/dL (ref 0.44–1.00)
GFR, Estimated: >60 mL/min (ref >60)
Glucose, Bld: 111 mg/dL — ABNORMAL HIGH (ref 70–99)
Potassium: 3.9 mmol/L (ref 3.5–5.1)
Sodium: 135 mmol/L (ref 135–145)
Total Bilirubin: <0.1 mg/dL — ABNORMAL LOW (ref 0.3–1.2)
Total Protein: 8.2 g/dL — ABNORMAL HIGH (ref 6.5–8.1)

## 2021-04-17 LAB — TROPONIN I (HIGH SENSITIVITY)
Troponin I (High Sensitivity): <2 ng/L (ref <18)
Troponin I (High Sensitivity): <2 ng/L (ref <18)

## 2021-04-17 LAB — D-DIMER, QUANTITATIVE: D-Dimer, Quant: 0.45 ug/mL-FEU (ref 0.00–0.50)

## 2021-04-17 LAB — LIPASE, BLOOD: Lipase: 28 U/L (ref 11–51)

## 2021-04-17 MED ORDER — ASPIRIN 81 MG PO CHEW
324.0000 mg | CHEWABLE_TABLET | Freq: Once | ORAL | Status: AC
  Administered 2021-04-17: 324 mg via ORAL
  Filled 2021-04-17: qty 4

## 2021-04-17 MED ORDER — ASPIRIN 325 MG PO TABS
325.0000 mg | ORAL_TABLET | Freq: Once | ORAL | Status: AC
  Administered 2021-04-17: 325 mg via ORAL
  Filled 2021-04-17: qty 1

## 2021-04-17 NOTE — ED Triage Notes (Signed)
C/o sharp midsternal chest pain starting this am as she walked into work. Also complaining of difficulty breathing.

## 2021-04-17 NOTE — ED Notes (Signed)
ED Provider at bedside. 

## 2021-04-17 NOTE — ED Provider Notes (Signed)
[facility] EMERGENCY DEPARTMENT Provider Note   CSN: [id number] Arrival date & time: 04/17/21  0630     History No chief complaint on file.   Julie Lyons is a 40 y.o. female.  40yo F w/ PMH below including HTN, GERD who p/w chest pain. Just PTA, pt was walking into work when she suddenly began to have pain in L back shooting into L chest. Pain is sharp, severe, and worse w/ inspiration. It is also worse anytime she moves L arm or lets arm hang by her side. No change in physical activity or heavy lifting. She reports SOB, no N/V.  Swelling or pain, recent travel, estrogen use, history of blood clots, or history of cancer.  Family history notable for father with heart problems.  Patient was evaluated in the cardiology clinic last fall and ordered for stress test and echo but due to insurance issues did not get the studies.  She has appointment scheduled next week with cardiology.  She did not take her blood pressure medication today but she normally takes amlodipine daily as well 81mg  aspirin daily.  The history is provided by the patient.       Past Medical History:  Diagnosis Date  . Asthma 11/07/2011  . Benign essential hypertension antepartum 11/07/2011   Baseline labs normal;  24 hour urine 273 mg. Repeat 1/24 156mg  Start 2X/week testing at 32 weeks   . Cervical incompetence with SAB at 16 weeks (silent dilation and delivery) 11/07/2011   S/p Cerclage @ 12 wks   . Chest discomfort 11/01/2020  . Cigarette smoker 11/01/2020  . Closed comminuted right humeral fracture 02/05/2019  . Essential hypertension 11/01/2020  . Genital herpes 12/26/2011  . GERD (gastroesophageal reflux disease)    occas- no meds  . Headache(784.0)   . History of kidney stones   . 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 Hypertension   . Obesity (BMI 35.0-39.9 without comorbidity) 11/01/2020  . Ovarian cyst   . Proteinuria complicating pregnancy 01/09/2012   Had Citrobacter with 4+ protein, completed  abx, now with 2+ again--repeat culture and if present again, needs repeat 24 hour urine--156mg /24 hours.   . Supervision of high-risk pregnancy 12/26/2011   Normal first screen and NT.  AFP negative 12/26/11 - see media tab Early1 hour gtt = 81, second 1 hr 112, nl anatomy  Plans BTL-has had one tube and ovary removed-right side-records reviewed-will try to obtain records from [facility]   . UTI in pregnancy, antepartum 11/07/2011   Citrobacter-treated with Bactrim--on prophylaxis     Patient Active Problem List   Diagnosis Date Noted  . Essential hypertension 11/01/2020  . Obesity (BMI 35.0-39.9 without comorbidity) 11/01/2020  . Chest discomfort 11/01/2020  . Cigarette smoker 11/01/2020  . GERD (gastroesophageal reflux disease)   . Hypertension   . Ovarian cyst   . Closed comminuted right humeral fracture 02/05/2019  . 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 Proteinuria complicating pregnancy 01/09/2012  . Genital herpes 12/26/2011  . Supervision of high-risk pregnancy 12/26/2011  . Cervical incompetence with SAB at 16 weeks (silent dilation and delivery) 11/07/2011  . UTI in pregnancy, antepartum 11/07/2011  . Benign essential hypertension antepartum 11/07/2011    Past Surgical History:  Procedure Laterality Date  . CERVICAL CERCLAGE  12/02/2011   Procedure: CERCLAGE CERVICAL;  Surgeon: 13/07/2011, MD;  Location: [facility];  Service: Gynecology;  Laterality: N/A;  . CERVICAL CERCLAGE  05/15/2012   Procedure: CERCLAGE CERVICAL;  Surgeon: Tereso Newcomer, MD;  Location:  [facility];  Service: Gynecology;  Laterality: N/A;  removal of cerclage from cervix  . OOPHORECTOMY     left ovary and tube  . ORIF HUMERUS FRACTURE Right 02/05/2019   Procedure: OPEN REDUCTION INTERNAL FIXATION (ORIF)  RIGHT HUMERAL SHAFT FRACTURE;  Surgeon: Tarry Kos, MD;  Location: [facility];  Service: Orthopedics;  Laterality: Right;  . TONSILLECTOMY    . TUBAL LIGATION  08/18/2012   Procedure: ESSURE TUBAL  STERILIZATION;  Surgeon: Willodean Rosenthal, MD;  Location: [facility];  Service: Gynecology;  Laterality: Left;  Marland Kitchen VAGINAL DELIVERY  1997, 2000. 2004     OB History    Gravida  6   Para  4   Term  4   Preterm      AB  2   Living  4     SAB  1   IAB  1   Ectopic      Multiple      Live Births  3           Family History  Problem Relation Age of Onset  . Hypertension Father   . Anesthesia problems Mother     Social History   Tobacco Use  . Smoking status: Former Smoker    Packs/day: 0.25    Years: 10.00    Pack years: 2.50    Types: Cigarettes    Quit date: 10/16/2020    Years since quitting: 0.5  . Smokeless tobacco: Never Used  Substance Use Topics  . Alcohol use: Yes    Comment: occassionally  . Drug use: No    Home Medications Prior to Admission medications   Medication Sig Start Date End Date Taking? Authorizing Provider  aspirin 81 MG chewable tablet Chew 81 mg by mouth daily.   Yes [provider]  albuterol (VENTOLIN HFA) 108 (90 Base) MCG/ACT inhaler Inhale 2 puffs into the lungs every 6 (six) hours as needed for wheezing or shortness of breath. 11/01/20   Revankar, Aundra Dubin, MD  ALBUTEROL IN Inhale 2 puffs into the lungs as needed.     [provider]  amLODipine (NORVASC) 10 MG tablet Take 0.5 tablets (5 mg total) by mouth daily. 11/01/20 01/30/21  Revankar, Aundra Dubin, MD    Allergies    Patient has no known allergies.  Review of Systems   Review of Systems All other systems reviewed and are negative except that which was mentioned in HPI  Physical Exam Updated Vital Signs BP (!) 156/95 (BP Location: Right Arm)   Pulse 68   Temp 98.4 F (36.9 C) (Oral)   Resp 14   Ht 5\' 7"  (1.702 m)   Wt 104.3 kg   LMP 04/14/2021   SpO2 97%   BMI 36.02 kg/m   Physical Exam Vitals and nursing note reviewed.  Constitutional:      General: She is not in acute distress.    Appearance: Normal appearance.     Comments:  Uncomfortable, holding L arm on pillow  HENT:     Head: Normocephalic and atraumatic.  Eyes:     Conjunctiva/sclera: Conjunctivae normal.  Cardiovascular:     Rate and Rhythm: Normal rate and regular rhythm.     Heart sounds: Normal heart sounds. No murmur heard.   Pulmonary:     Effort: Pulmonary effort is normal.     Breath sounds: Normal breath sounds.  Chest:     Chest wall: Tenderness present.  Abdominal:     General:  Abdomen is flat. Bowel sounds are normal. There is no distension.     Palpations: Abdomen is soft.     Tenderness: There is no abdominal tenderness.  Musculoskeletal:     Right lower leg: No edema.     Left lower leg: No edema.     Comments: Tenderness along L chest wall and L thoracic back; holding arm in adduction not wanting to move it due to pain  Skin:    General: Skin is warm and dry.  Neurological:     Mental Status: She is alert and oriented to person, place, and time.     Comments: fluent  Psychiatric:        Mood and Affect: Mood normal.        Behavior: Behavior normal.     ED Results / Procedures / Treatments   Labs (all labs ordered are listed, but only abnormal results are displayed) Labs Reviewed  COMPREHENSIVE METABOLIC PANEL - Abnormal; Notable for the following components:      Result Value   Glucose, Bld 111 (*)    Total Protein 8.2 (*)    Total Bilirubin <0.1 (*)    All other components within normal limits  LIPASE, BLOOD  CBC WITH DIFFERENTIAL/PLATELET  D-DIMER, QUANTITATIVE  PREGNANCY, URINE  TROPONIN I (HIGH SENSITIVITY)  TROPONIN I (HIGH SENSITIVITY)    EKG EKG Interpretation  Date/Time:  Tuesday April 17 2021 09:47:15 EDT Ventricular Rate:  95 PR Interval:  160 QRS Duration: 81 QT Interval:  353 QTC Calculation: 444 R Axis:   63 Text Interpretation: Sinus rhythm Borderline T wave abnormalities mild T wave flattening compared to previous T wave inversion in III, due to artifact it is unclear whether present on  previous tracing Confirmed by Frederick PeersLittle, Wiley Flicker [phone]([id number]) on 04/17/2021 9:57:40 AM   Radiology DG Chest 2 View  Result Date: 04/17/2021 CLINICAL DATA:  Sudden onset of LEFT chest and LEFT upper back pain today, worsened pain with inspiration and heavy pressure; history hypertension, asthma EXAM: CHEST - 2 VIEW COMPARISON:  10/30/2020 FINDINGS: Upper normal heart size. Mediastinal contours and pulmonary vascularity normal. Lungs clear. No pulmonary infiltrate, pleural effusion, or pneumothorax. Osseous structures unremarkable. IMPRESSION: No acute abnormalities. Electronically Signed   By: Ulyses SouthwardMark  Boles M.D.   On: 04/17/2021 10:50    Procedures Procedures   Medications Ordered in ED Medications  aspirin chewable tablet 324 mg (324 mg Oral Given 04/17/21 1006)  aspirin tablet 325 mg (325 mg Oral Given 04/17/21 1007)    ED Course  I have reviewed the triage vital signs and the nursing notes.  Pertinent labs & imaging results that were available during my care of the patient were reviewed by me and considered in my medical decision making (see chart for details).    MDM Rules/Calculators/A&P                          Uncomfortable on exam and tender on chest wall and thoracic back. Sx highly suggestive of MSK etiology given reproducible on exam and worse w/ arm movements. However, given risk factors for heart disease, obtained EKG, trops, and CXR.  EKG without acute ischemic changes, unclear whether possible mild T wave flattening compared to previous but no ST elevation.  Serial troponins negative.  D-dimer negative making PE unlikely.  Chest x-ray clear.  On reassessment, patient is comfortable appearing.  She has follow-up with cardiology next week and echo already scheduled.  Based on previous  cardiology notes it sounds like they had planned to do a stress test.  I have encouraged her to follow-up for these evaluations and I have discussed supportive measures for her symptoms.  Reviewed return  precautions and she voiced understanding. Final Clinical Impression(s) / ED Diagnoses Final diagnoses:  Atypical chest pain  Left arm pain    Rx / DC Orders ED Discharge Orders    None       Elbony Mcclimans, Ambrose Finland, MD 04/17/21 1337

## 2021-05-04 ENCOUNTER — Ambulatory Visit (HOSPITAL_BASED_OUTPATIENT_CLINIC_OR_DEPARTMENT_OTHER): Admission: RE | Admit: 2021-05-04 | Payer: BC Managed Care – PPO | Source: Ambulatory Visit

## 2021-11-07 ENCOUNTER — Other Ambulatory Visit: Payer: Self-pay | Admitting: Cardiology

## 2022-03-28 DIAGNOSIS — W57XXXA Bitten or stung by nonvenomous insect and other nonvenomous arthropods, initial encounter: Secondary | ICD-10-CM | POA: Diagnosis not present

## 2022-03-28 DIAGNOSIS — L509 Urticaria, unspecified: Secondary | ICD-10-CM | POA: Diagnosis not present

## 2022-03-28 DIAGNOSIS — S60561A Insect bite (nonvenomous) of right hand, initial encounter: Secondary | ICD-10-CM | POA: Diagnosis not present

## 2023-05-07 ENCOUNTER — Other Ambulatory Visit: Payer: Self-pay

## 2023-05-07 ENCOUNTER — Encounter (HOSPITAL_BASED_OUTPATIENT_CLINIC_OR_DEPARTMENT_OTHER): Payer: Self-pay | Admitting: Emergency Medicine

## 2023-05-07 ENCOUNTER — Emergency Department (HOSPITAL_BASED_OUTPATIENT_CLINIC_OR_DEPARTMENT_OTHER)
Admission: EM | Admit: 2023-05-07 | Discharge: 2023-05-08 | Disposition: A | Discharge status: Home / Self Care | Payer: BC Managed Care – PPO | Attending: Emergency Medicine | Admitting: Emergency Medicine

## 2023-05-07 ENCOUNTER — Emergency Department (HOSPITAL_BASED_OUTPATIENT_CLINIC_OR_DEPARTMENT_OTHER): Payer: BC Managed Care – PPO

## 2023-05-07 DIAGNOSIS — R072 Precordial pain: Secondary | ICD-10-CM | POA: Diagnosis not present

## 2023-05-07 DIAGNOSIS — J45909 Unspecified asthma, uncomplicated: Secondary | ICD-10-CM | POA: Insufficient documentation

## 2023-05-07 DIAGNOSIS — I10 Essential (primary) hypertension: Secondary | ICD-10-CM | POA: Diagnosis not present

## 2023-05-07 DIAGNOSIS — Z87891 Personal history of nicotine dependence: Secondary | ICD-10-CM | POA: Insufficient documentation

## 2023-05-07 LAB — CBC WITH DIFFERENTIAL/PLATELET
Abs Immature Granulocytes: 0.01 10*3/uL (ref 0.00–0.07)
Basophils Absolute: 0 10*3/uL (ref 0.0–0.1)
Basophils Relative: 1 %
Eosinophils Absolute: 0.3 10*3/uL (ref 0.0–0.5)
Eosinophils Relative: 4 %
HCT: 40.7 % (ref 36.0–46.0)
Hemoglobin: 13.4 g/dL (ref 12.0–15.0)
Immature Granulocytes: 0 %
Lymphocytes Relative: 41 %
Lymphs Abs: 2.9 10*3/uL (ref 0.7–4.0)
MCH: 29.7 pg (ref 26.0–34.0)
MCHC: 32.9 g/dL (ref 30.0–36.0)
MCV: 90.2 fL (ref 80.0–100.0)
Monocytes Absolute: 0.5 10*3/uL (ref 0.1–1.0)
Monocytes Relative: 7 %
Neutro Abs: 3.3 10*3/uL (ref 1.7–7.7)
Neutrophils Relative %: 47 %
Platelets: 307 10*3/uL (ref 150–400)
RBC: 4.51 MIL/uL (ref 3.87–5.11)
RDW: 13.2 % (ref 11.5–15.5)
WBC: 7 10*3/uL (ref 4.0–10.5)
nRBC: 0 % (ref 0.0–0.2)

## 2023-05-07 LAB — BASIC METABOLIC PANEL
Anion gap: 9 (ref 5–15)
BUN: 18 mg/dL (ref 6–20)
CO2: 24 mmol/L (ref 22–32)
Calcium: 9.3 mg/dL (ref 8.9–10.3)
Chloride: 102 mmol/L (ref 98–111)
Creatinine, Ser: 1.07 mg/dL — ABNORMAL HIGH (ref 0.44–1.00)
GFR, Estimated: >60 mL/min (ref >60)
Glucose, Bld: 120 mg/dL — ABNORMAL HIGH (ref 70–99)
Potassium: 3.7 mmol/L (ref 3.5–5.1)
Sodium: 135 mmol/L (ref 135–145)

## 2023-05-07 LAB — TROPONIN I (HIGH SENSITIVITY): Troponin I (High Sensitivity): <2 ng/L (ref <18)

## 2023-05-07 NOTE — ED Triage Notes (Signed)
Pt c/o RT and LT CP x 1 wk; describes as pressure; BLE edema; not taking BP meds x 1 yr

## 2023-05-08 ENCOUNTER — Emergency Department (HOSPITAL_BASED_OUTPATIENT_CLINIC_OR_DEPARTMENT_OTHER): Payer: BC Managed Care – PPO

## 2023-05-08 LAB — TROPONIN I (HIGH SENSITIVITY): Troponin I (High Sensitivity): <2 ng/L (ref <18)

## 2023-05-08 LAB — HCG, QUANTITATIVE, PREGNANCY: hCG, Beta Chain, Quant, S: <1 m[IU]/mL (ref <5)

## 2023-05-08 MED ORDER — ALUM & MAG HYDROXIDE-SIMETH 200-200-20 MG/5ML PO SUSP
30.0000 mL | Freq: Once | ORAL | Status: AC
  Administered 2023-05-08: 30 mL via ORAL
  Filled 2023-05-08: qty 30

## 2023-05-08 MED ORDER — HYDROCHLOROTHIAZIDE 25 MG PO TABS
25.0000 mg | ORAL_TABLET | Freq: Once | ORAL | Status: AC
  Administered 2023-05-08: 25 mg via ORAL
  Filled 2023-05-08: qty 1

## 2023-05-08 MED ORDER — HYDROCHLOROTHIAZIDE 25 MG PO TABS: 25.0000 mg | ORAL_TABLET | Freq: Every day | ORAL | 0 refills | Status: DC

## 2023-05-08 MED ORDER — IOHEXOL 350 MG/ML SOLN
100.0000 mL | Freq: Once | INTRAVENOUS | Status: AC | PRN
  Administered 2023-05-08: 100 mL via INTRAVENOUS

## 2023-05-08 NOTE — ED Provider Notes (Signed)
[place] EMERGENCY DEPARTMENT AT [facility] Provider Note   CSN: [id number] Arrival date & time: 05/07/23  2039     History  Chief Complaint  Patient presents with   Chest Pain    Julie Lyons is a 42 y.o. female.  The history is provided by the patient.  Chest Pain Pain location:  L chest and R chest Pain radiates to:  Does not radiate Pain severity:  Moderate Onset quality:  Gradual Duration:  1 week Timing:  Constant Progression:  Unchanged Chronicity:  New Context: at rest   Relieved by:  Nothing Worsened by:  Nothing Ineffective treatments:  None tried Associated symptoms: no fever and no vomiting   Risk factors: no birth control and no diabetes mellitus   Patient with a history of GERD and HTN presents with  1 week of bilateral chest pain. No nausea vomiting or diaphoresis. Patient reports she is not currently on blood pressure medication and has not been for more than 1 year. Is concerned about pedal edema.  No exertional symptoms.  No travel.      Past Medical History:  Diagnosis Date   Asthma 11/07/2011   Benign essential hypertension antepartum 11/07/2011   Baseline labs normal;  24 hour urine 273 mg. 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    Supervision of high-risk pregnancy 12/26/2011   Normal first screen and NT.  AFP negative 12/26/11 - see media tab Early1 hour gtt = 81, second 1 hr 112, nl anatomy  Plans BTL-has had one tube and ovary removed-right side-records reviewed-will try to obtain records from [facility]    UTI in pregnancy, antepartum 11/07/2011   Citrobacter-treated with Bactrim--on prophylaxis       Allergies    Patient has no known allergies.    Review of Systems   Review of Systems  Constitutional:  Negative for fever.  HENT:  Negative for facial swelling.   Eyes:  Negative for redness.  Respiratory:  Negative for wheezing and stridor.   Cardiovascular:  Positive for chest pain.  Gastrointestinal:  Negative for vomiting.  All other systems reviewed and are negative.   Physical Exam Updated Vital Signs BP (!) 171/115   Pulse (!) 56   Temp 98.6 F (37 C) (Oral)   Resp 18   Ht 5\' 7"  (1.702 m)   Wt 104.3 kg   LMP 04/22/2023   SpO2 99%   BMI 36.02 kg/m  Physical Exam Constitutional:      General: She is not in acute distress.    Appearance: Normal appearance. She is well-developed.  HENT:     Head: Normocephalic and atraumatic.     Nose: Nose normal.  Eyes:     Pupils: Pupils are equal, round,  and reactive to light.  Cardiovascular:     Rate and Rhythm: Normal rate and regular rhythm.     Pulses: Normal pulses.     Heart sounds: Normal heart sounds.  Pulmonary:     Effort: Pulmonary effort is normal. No respiratory distress.     Breath sounds: Normal breath sounds.  Abdominal:     General: Bowel sounds are normal. There is no distension.     Palpations: Abdomen is soft.     Tenderness: There is no abdominal tenderness. There is no guarding or rebound.  Genitourinary:    Vagina: No vaginal discharge.  Musculoskeletal:        General: Normal range of motion.     Cervical back: Neck supple.     Comments: Pedal edema  Skin:    General: Skin is warm and dry.      Capillary Refill: Capillary refill takes less than 2 seconds.     Findings: No erythema or rash.  Neurological:     General: No focal deficit present.     Mental Status: She is alert and oriented to person, place, and time.     Deep Tendon Reflexes: Reflexes normal.  Psychiatric:        Thought Content: Thought content normal.     ED Results / Procedures / Treatments   Labs (all labs ordered are listed, but only abnormal results are displayed) Results for orders placed or performed during the hospital encounter of 05/07/23  Basic metabolic panel  Result Value Ref Range   Sodium 135 135 - 145 mmol/L   Potassium 3.7 3.5 - 5.1 mmol/L   Chloride 102 98 - 111 mmol/L   CO2 24 22 - 32 mmol/L   Glucose, Bld 120 (H) 70 - 99 mg/dL   BUN 18 6 - 20 mg/dL   Creatinine, Ser 1.61 (H) 0.44 - 1.00 mg/dL   Calcium 9.3 8.9 - 09.6 mg/dL   GFR, Estimated >04 >54 mL/min   Anion gap 9 5 - 15  CBC with Differential  Result Value Ref Range   WBC 7.0 4.0 - 10.5 K/uL   RBC 4.51 3.87 - 5.11 MIL/uL   Hemoglobin 13.4 12.0 - 15.0 g/dL   HCT 09.8 11.9 - 14.7 %   MCV 90.2 80.0 - 100.0 fL   MCH 29.7 26.0 - 34.0 pg   MCHC 32.9 30.0 - 36.0 g/dL   RDW 82.9 56.2 - 13.0 %   Platelets 307 150 - 400 K/uL   nRBC 0.0 0.0 - 0.2 %   Neutrophils Relative % 47 %   Neutro Abs 3.3 1.7 - 7.7 K/uL   Lymphocytes Relative 41 %   Lymphs Abs 2.9 0.7 - 4.0 K/uL   Monocytes Relative 7 %   Monocytes Absolute 0.5 0.1 - 1.0 K/uL   Eosinophils Relative 4 %   Eosinophils Absolute 0.3 0.0 - 0.5 K/uL   Basophils Relative 1 %   Basophils Absolute 0.0 0.0 - 0.1 K/uL   Immature Granulocytes 0 %   Abs Immature Granulocytes 0.01 0.00 - 0.07 K/uL  hCG, quantitative, pregnancy  Result Value Ref Range   hCG, Beta Chain, Quant, S <1 <5 mIU/mL  Troponin I (High Sensitivity)  Result Value Ref Range   Troponin I (High Sensitivity) <2 <18 ng/L  Troponin I (High Sensitivity)  Result Value Ref Range   Troponin I (High Sensitivity) <2  <18 ng/L   CT Angio Chest PE W and/or Wo Contrast  Result Date: 05/08/2023 CLINICAL  DATA:  Syncope, chest pain, bilateral edema. EXAM: CT ANGIOGRAPHY CHEST WITH CONTRAST TECHNIQUE: Multidetector CT imaging of the chest was performed using the standard protocol during bolus administration of intravenous contrast. Multiplanar CT image reconstructions and MIPs were obtained to evaluate the vascular anatomy. RADIATION DOSE REDUCTION: This exam was performed according to the departmental dose-optimization program which includes automated exposure control, adjustment of the mA and/or kV according to patient size and/or use of iterative reconstruction technique. CONTRAST:  OMNIPAQUE IOHEXOL 350 MG/ML SOLN COMPARISON:  None Available. FINDINGS: Cardiovascular: The heart is enlarged and there is no pericardial effusion. A small coronary artery calcification is seen. The aorta and pulmonary trunk are normal in caliber. No evidence of pulmonary embolism. Mediastinum/Nodes: No enlarged mediastinal, hilar, or axillary lymph nodes. Thyroid gland, trachea, and esophagus demonstrate no significant findings. Lungs/Pleura: Lungs are clear. No pleural effusion or pneumothorax. Upper Abdomen: No acute abnormality. Musculoskeletal: No acute osseous abnormality. Review of the MIP images confirms the above findings. IMPRESSION: 1. No evidence of pulmonary embolism or other acute process. 2. Mild cardiomegaly with coronary artery calcification. Electronically Signed   By: Thornell Sartorius M.D.   On: 05/08/2023 01:15   DG Chest 2 View  Result Date: 05/07/2023 CLINICAL DATA:  Chest pain. EXAM: CHEST - 2 VIEW COMPARISON:  Chest radiograph 04/17/2021 FINDINGS: The cardiomediastinal contours are normal. The lungs are clear. Pulmonary vasculature is normal. No consolidation, pleural effusion, or pneumothorax. No acute osseous abnormalities are seen. IMPRESSION: Negative radiographs of the chest. Electronically Signed   By: Narda Rutherford M.D.   On: 05/07/2023 21:17    EKG EKG Interpretation  Date/Time:  Wednesday May 07 2023 20:50:31 EDT Ventricular Rate:  92 PR Interval:  142 QRS Duration: 83 QT Interval:  368 QTC Calculation: 456 R Axis:   53 Text Interpretation: Sinus rhythm Confirmed by Nicanor Alcon, Pheonix Wisby ([id number]) on 05/07/2023 11:18:11 PM  Radiology CT Angio Chest PE W and/or Wo Contrast  Result Date: 05/08/2023 CLINICAL DATA:  Syncope, chest pain, bilateral edema. EXAM: CT ANGIOGRAPHY CHEST WITH CONTRAST TECHNIQUE: Multidetector CT imaging of the chest was performed using the standard protocol during bolus administration of intravenous contrast. Multiplanar CT image reconstructions and MIPs were obtained to evaluate the vascular anatomy. RADIATION DOSE REDUCTION: This exam was performed according to the departmental dose-optimization program which includes automated exposure control, adjustment of the mA and/or kV according to patient size and/or use of iterative reconstruction technique. CONTRAST:  OMNIPAQUE IOHEXOL 350 MG/ML SOLN COMPARISON:  None Available. FINDINGS: Cardiovascular: The heart is enlarged and there is no pericardial effusion. A small coronary artery calcification is seen. The aorta and pulmonary trunk are normal in caliber. No evidence of pulmonary embolism. Mediastinum/Nodes: No enlarged mediastinal, hilar, or axillary lymph nodes. Thyroid gland, trachea, and esophagus demonstrate no significant findings. Lungs/Pleura: Lungs are clear. No pleural effusion or pneumothorax. Upper Abdomen: No acute abnormality. Musculoskeletal: No acute osseous abnormality. Review of the MIP images confirms the above findings. IMPRESSION: 1. No evidence of pulmonary embolism or other acute process. 2. Mild cardiomegaly with coronary artery calcification. Electronically Signed   By: Thornell Sartorius M.D.   On: 05/08/2023 01:15   DG Chest 2 View  Result Date: 05/07/2023 CLINICAL DATA:  Chest pain. EXAM: CHEST - 2 VIEW  COMPARISON:  Chest radiograph 04/17/2021 FINDINGS: The cardiomediastinal contours are normal. The lungs are clear. Pulmonary vasculature is normal. No consolidation, pleural effusion, or pneumothorax. No acute osseous abnormalities are seen. IMPRESSION: Negative radiographs of the chest.  Electronically Signed   By: Narda Rutherford M.D.   On: 05/07/2023 21:17    Procedures Procedures    Medications Ordered in ED Medications  iohexol (OMNIPAQUE) 350 MG/ML injection 100 mL (100 mLs Intravenous Contrast Given 05/08/23 0041)  hydrochlorothiazide (HYDRODIURIL) tablet 25 mg (25 mg Oral Given 05/08/23 0112)    ED Course/ Medical Decision Making/ A&P                             Medical Decision Making Patient with a week of bilateral chest pain and concerns about hypertension and pedal edema.  Patient does not have a PMD and has not been on medication for more than 1 year   Amount and/or Complexity of Data Reviewed External Data Reviewed: notes.    Details: Previous notes reviewed  Labs: ordered.    Details: All labs reviewed: 2 negative troponins < 2.  Pregnancy is negative. Normal sodium 135, normal potassium 3.7 creatinine slight elevation 1.07. normal white count 7, normal hemoglobin 13.4 normal platelet count  Radiology: ordered and independent interpretation performed.    Details: No PE or pulmonary edema on CTA by me  ECG/medicine tests: ordered and independent interpretation performed. Decision-making details documented in ED Course.  Risk OTC drugs. Prescription drug management. Risk Details: Patient started on HCTZ for blood pressure and will help with pedal edema.  I am referring patient to a PMD as she does not have one for ongoing care and health maintenance.  I have placed an ambulatory referral to cardiology for ongoing management of HTN.  Patient has ruled out for MI in the ED with negative EKG and 2 negative troponins.  Heart score is 1 low risk for MACE.  Ruled out for PE,  pneumonia and CHF in the ED on CTA of the chest.  Well appearing.  RX for HCTZ provided.  Stable for discharge with close follow up.      Final Clinical Impression(s) / ED Diagnoses Return for intractable cough, coughing up blood, fevers > 100.4 unrelieved by medication, shortness of breath, intractable vomiting, chest pain, shortness of breath, weakness, numbness, changes in speech, facial asymmetry, abdominal pain, passing out, Inability to tolerate liquids or food, cough, altered mental status or any concerns. No signs of systemic illness or infection. The patient is nontoxic-appearing on exam and vital signs are within normal limits.  I have reviewed the triage vital signs and the nursing notes. Pertinent labs & imaging results that were available during my care of the patient were reviewed by me and considered in my medical decision making (see chart for details). After history, exam, and medical workup I feel the patient has been appropriately medically screened and is safe for discharge home. Pertinent diagnoses were discussed with the patient. Patient was given return precautions.   Rx / DC Orders ED Discharge Orders          Ordered    hydrochlorothiazide (HYDRODIURIL) 25 MG tablet  Daily        05/08/23 0134    Ambulatory referral to Cardiology       Comments: If you have not heard from the Cardiology office within the next 72 hours please call [phone].   05/08/23 0142              Tommie Bohlken, MD 05/08/23 2952

## 2023-05-13 ENCOUNTER — Telehealth (HOSPITAL_BASED_OUTPATIENT_CLINIC_OR_DEPARTMENT_OTHER): Payer: Self-pay | Admitting: Emergency Medicine

## 2023-05-20 NOTE — Progress Notes (Signed)
Cardiology Clinic Note   Patient Name: Julie Lyons Date of Encounter: 05/23/2023  Primary Care Provider:  Patient, No Pcp Per Primary Cardiologist:  None  Patient Profile    Julie Lyons 42 year old female presents to the clinic today for follow-up evaluation of her hypertension and chest discomfort.  Past Medical History    Past Medical History:  Diagnosis Date   Asthma 11/07/2011   Benign essential hypertension antepartum 11/07/2011   Baseline labs normal;  24 hour urine 273 mg. 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    Supervision of high-risk pregnancy 12/26/2011   Normal first screen and NT.  AFP negative 12/26/11 - see media tab Early1 hour gtt = 81, second 1 hr 112, nl anatomy  Plans BTL-has had one tube and ovary removed-right side-records reviewed-will try to obtain records from [facility]    UTI in pregnancy, antepartum 11/07/2011   Citrobacter-treated with Bactrim--on prophylaxis    Past Surgical History:  Procedure Laterality Date   CERVICAL CERCLAGE  12/02/2011   Procedure: CERCLAGE CERVICAL;  Surgeon: Tereso Newcomer, MD;  Location: [facility];  Service: Gynecology;  Laterality: N/A;   CERVICAL CERCLAGE  05/15/2012   Procedure: CERCLAGE CERVICAL;   Surgeon: Adam Phenix, MD;  Location: [facility];  Service: Gynecology;  Laterality: N/A;  removal of cerclage from cervix   OOPHORECTOMY     left ovary and tube   ORIF HUMERUS FRACTURE Right 02/05/2019   Procedure: OPEN REDUCTION INTERNAL FIXATION (ORIF)  RIGHT HUMERAL SHAFT FRACTURE;  Surgeon: Tarry Kos, MD;  Location: [facility];  Service: Orthopedics;  Laterality: Right;   TONSILLECTOMY     TUBAL LIGATION  08/18/2012   Procedure: ESSURE TUBAL STERILIZATION;  Surgeon: Willodean Rosenthal, MD;  Location: [facility];  Service: Gynecology;  Laterality: Left;   VAGINAL DELIVERY  1997, 2000. 2004    Allergies  No Known Allergies  History of Present Illness    Julie Lyons has a PMH of asthma, HTN, tobacco use, GERD, HSV-2, obesity, ovarian cyst, and UTI.  She presented to the emergency department on 05/07/2023 with complaints of chest discomfort x 1 week.  She was concerned about her blood pressure (171/115) and pedal edema as well.  She reported that she had not been on medication for 1 year.  Her troponins were negative x 2.  Her BMP and CBC were unremarkable.  She was negative for PE on CT.  She was started on HCTZ for her blood pressure and lower extremity edema.  She was instructed to follow-up with PCP and cardiology.  She was discharged in stable condition on 05/08/2023.  She presents to the clinic today for follow-up evaluation and states that her blood pressure has been much better on  HCTZ.  We reviewed her hospital visit and she expressed understanding.  She has not been monitoring her blood pressure at home.  Today in the clinic it is 138/82.  I will continue her HCTZ.  We reviewed secondary causes of hypertension.  She expressed understanding.  I will have her reduce her caffeine intake.  We will give salty 6 diet she, order BMP today, and plan follow-up in 3 to 4 months.  Today she denies chest pain, lower extremity edema, fatigue, palpitations, melena, hematuria,  hemoptysis, and diaphoresis.   Home Medications    Prior to Admission medications   Medication Sig Start Date End Date Taking? Authorizing Provider  albuterol (VENTOLIN HFA) 108 (90 Base) MCG/ACT inhaler Inhale 2 puffs into the lungs every 6 (six) hours as needed for wheezing or shortness of breath. 11/01/20   Revankar, Aundra Dubin, MD  ALBUTEROL IN Inhale 2 puffs into the lungs as needed.     [provider]  amLODipine (NORVASC) 10 MG tablet TAKE 1/2 TABLET(5 MG) BY MOUTH DAILY 11/07/21   Revankar, Aundra Dubin, MD  aspirin 81 MG chewable tablet Chew 81 mg by mouth daily.    [provider]  hydrochlorothiazide (HYDRODIURIL) 25 MG tablet Take 1 tablet (25 mg total) by mouth daily. 05/08/23   Palumbo, April, MD    Family History    Family History  Problem Relation Age of Onset   Hypertension Father    Anesthesia problems Mother    She indicated that the status of her mother is unknown. She indicated that the status of her father is unknown.  Social History    Social History   Socioeconomic History   Marital status: Single    Spouse name: Not on file   Number of children: Not on file   Years of education: Not on file   Highest education level: Not on file  Occupational History   Not on file  Tobacco Use   Smoking status: Former    Packs/day: 0.25    Years: 10.00    Additional pack years: 0.00    Total pack years: 2.50    Types: Cigarettes    Quit date: 10/16/2020    Years since quitting: 2.6   Smokeless tobacco: Never  Substance and Sexual Activity   Alcohol use: Yes    Comment: occassionally   Drug use: No   Sexual activity: Yes  Other Topics Concern   Not on file  Social History Narrative   Not on file   Social Determinants of Health   Financial Resource Strain: Not on file  Food Insecurity: Not on file  Transportation Needs: Not on file  Physical Activity: Not on file  Stress: Not on file  Social Connections: Not on file  Intimate Partner  Violence: Not on file     Review of Systems    General:  No chills, fever, night sweats or weight changes.  Cardiovascular:  No chest pain, dyspnea on exertion, edema, orthopnea, palpitations, paroxysmal nocturnal dyspnea. Dermatological: No rash, lesions/masses Respiratory: No cough, dyspnea Urologic: No hematuria, dysuria Abdominal:   No nausea, vomiting, diarrhea, bright red blood per rectum, melena, or hematemesis Neurologic:  No visual changes, wkns, changes in mental status. All other systems reviewed and are otherwise negative except as noted above.  Physical Exam    VS:  BP 138/82   Pulse 73   Ht 5\' 7"  (1.702 m)   Wt 245 lb 12.8 oz (111.5 kg)   LMP 04/22/2023  SpO2 97%   BMI 38.50 kg/m  , BMI Body mass index is 38.5 kg/m. GEN: Well nourished, well developed, in no acute distress. HEENT: normal. Neck: Supple, no JVD, carotid bruits, or masses. Cardiac: RRR, no murmurs, rubs, or gallops. No clubbing, cyanosis, edema.  Radials/DP/PT 2+ and equal bilaterally.  Respiratory:  Respirations regular and unlabored, clear to auscultation bilaterally. GI: Soft, nontender, nondistended, BS + x 4. MS: no deformity or atrophy. Skin: warm and dry, no rash. Neuro:  Strength and sensation are intact. Psych: Normal affect.  Accessory Clinical Findings    Recent Labs: 05/07/2023: BUN 18; Creatinine, Ser 1.07; Hemoglobin 13.4; Platelets 307; Potassium 3.7; Sodium 135   Recent Lipid Panel No results found for: "CHOL", "TRIG", "HDL", "CHOLHDL", "VLDL", "LDLCALC", "LDLDIRECT"       ECG personally reviewed by me today-none today.   EKG 05/07/2023  Sinus rhythm no ectopy 92 bpm   Assessment & Plan   1.  Chest discomfort-denies chest pain today.  Recently seen in the emergency department on 05/07/2023.  She was noted to have elevated blood pressure and reported 1 week of chest discomfort.  High-sensitivity troponins negative, CBC unremarkable, BMP unremarkable, chest CT showed no  PE. No plans for ischemic evaluation at this time.  Essential hypertension-BP today 132/82.  Started on HCTZ in the ED. Low-sodium diet Secondary causes of hypertension given Maintain blood pressure log Continue HCTZ Repeat BMP   Lower extremity edema-pedal edema has improved with HCTZ. Lower extremity support stockings Elevate lower extremities when not active Continue HCTZ Low-sodium diet  Disposition: Follow-up with Dr. Tomie China or me in 2-3 months.   Thomasene Ripple. Annaleia Pence NP-C     05/23/2023, 9:11 AM [facility] [address] Office [phone] Fax [phone]    I spent 13 minutes examining this patient, reviewing medications, and using patient centered shared decision making involving her cardiac care.  Prior to her visit I spent greater than 20 minutes reviewing her past medical history,  medications, and prior cardiac tests.

## 2023-05-23 ENCOUNTER — Encounter: Payer: Self-pay | Admitting: General Practice

## 2023-05-23 ENCOUNTER — Ambulatory Visit: Payer: BC Managed Care – PPO | Attending: General Practice | Admitting: General Practice

## 2023-05-23 VITALS — BP 138/82 | HR 73 | Ht 67.0 in | Wt 245.8 lb

## 2023-05-23 DIAGNOSIS — R600 Localized edema: Secondary | ICD-10-CM

## 2023-05-23 DIAGNOSIS — I10 Essential (primary) hypertension: Secondary | ICD-10-CM

## 2023-05-23 DIAGNOSIS — R0789 Other chest pain: Secondary | ICD-10-CM | POA: Diagnosis not present

## 2023-05-23 MED ORDER — HYDROCHLOROTHIAZIDE 25 MG PO TABS: 25.0000 mg | ORAL_TABLET | Freq: Every day | ORAL | 3 refills | Status: AC

## 2023-05-23 NOTE — Patient Instructions (Signed)
Medication Instructions:   No changes   *If you need a refill on your cardiac medications before your next appointment, please call your pharmacy*   Lab Work:  BMET   If you have labs (blood work) drawn today and your tests are completely normal, you will receive your results only by: MyChart Message (if you have MyChart) OR A paper copy in the mail If you have any lab test that is abnormal or we need to change your treatment, we will call you to review the results.   Testing/Procedures: Not needed   Follow-Up: At [facility], you and your health needs are our priority.  As part of our continuing mission to provide you with exceptional heart care, we have created designated Provider Care Teams.  These Care Teams include your primary Cardiologist (physician) and Advanced Practice Providers (APPs -  Physician Assistants and Nurse Practitioners) who all work together to provide you with the care you need, when you need it.  We recommend signing up for the patient portal called "MyChart".  Sign up information is provided on this After Visit Summary.  MyChart is used to connect with patients for Virtual Visits (Telemedicine).  Patients are able to view lab/test results, encounter notes, upcoming appointments, etc.  Non-urgent messages can be sent to your provider as well.   To learn more about what you can do with MyChart, go to [email or web address].    Your next appointment:    3  to 4  month(s)  The format for your next appointment:   In Person  Provider:   Edd Fabian, FNP       Other Instructions   Your physician discussed the importance of regular exercise and recommended that you start or continue a regular exercise program for good health.  Increase exercise     Monitor your blood pressure for the next 2 weeks . Take a reading  one hour after taking blood pressure medications   Try to avoid caffeine , NSAID, St John wort , and licorice- they affect elevated  blood pressures

## 2023-05-24 LAB — BASIC METABOLIC PANEL
BUN/Creatinine Ratio: 18 (ref 9–23)
BUN: 16 mg/dL (ref 6–24)
CO2: 24 mmol/L (ref 20–29)
Calcium: 9.5 mg/dL (ref 8.7–10.2)
Chloride: 102 mmol/L (ref 96–106)
Creatinine, Ser: 0.9 mg/dL (ref 0.57–1.00)
Glucose: 99 mg/dL (ref 70–99)
Potassium: 4.8 mmol/L (ref 3.5–5.2)
Sodium: 138 mmol/L (ref 134–144)
eGFR: 82 mL/min/{1.73_m2} (ref >59)

## 2023-08-22 NOTE — Progress Notes (Deleted)
Cardiology Clinic Note   Patient Name: Julie Lyons Date of Encounter: 08/22/2023  Primary Care Provider:  Patient, No Pcp Per Primary Cardiologist:  None  Patient Profile    Julie Lyons 42 year old female presents to the clinic today for follow-up evaluation of her hypertension and chest discomfort.  Past Medical History    Past Medical History:  Diagnosis Date   Asthma 11/07/2011   Benign essential hypertension antepartum 11/07/2011   Baseline labs normal;  24 hour urine 273 mg. 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    Supervision of high-risk pregnancy 12/26/2011   Normal first screen and NT.  AFP negative 12/26/11 - see media tab Early1 hour gtt = 81, second 1 hr 112, nl anatomy  Plans BTL-has had one tube and ovary removed-right side-records reviewed-will try to obtain records from [facility]    UTI in pregnancy, antepartum 11/07/2011   Citrobacter-treated with Bactrim--on prophylaxis    Past Surgical History:  Procedure Laterality Date   CERVICAL CERCLAGE  12/02/2011   Procedure: CERCLAGE CERVICAL;  Surgeon: Tereso Newcomer, MD;  Location: [facility];  Service: Gynecology;  Laterality: N/A;   CERVICAL CERCLAGE  05/15/2012   Procedure: CERCLAGE CERVICAL;   Surgeon: Adam Phenix, MD;  Location: [facility];  Service: Gynecology;  Laterality: N/A;  removal of cerclage from cervix   OOPHORECTOMY     left ovary and tube   ORIF HUMERUS FRACTURE Right 02/05/2019   Procedure: OPEN REDUCTION INTERNAL FIXATION (ORIF)  RIGHT HUMERAL SHAFT FRACTURE;  Surgeon: Tarry Kos, MD;  Location: [facility];  Service: Orthopedics;  Laterality: Right;   TONSILLECTOMY     TUBAL LIGATION  08/18/2012   Procedure: ESSURE TUBAL STERILIZATION;  Surgeon: Willodean Rosenthal, MD;  Location: [facility];  Service: Gynecology;  Laterality: Left;   VAGINAL DELIVERY  1997, 2000. 2004    Allergies  No Known Allergies  History of Present Illness    [name] has a PMH of asthma, HTN, tobacco use, GERD, HSV-2, obesity, ovarian cyst, and UTI.  She presented to the emergency department on 05/07/2023 with complaints of chest discomfort x 1 week.  She was concerned about her blood pressure (171/115) and pedal edema as well.  She reported that she had not been on medication for 1 year.  Her troponins were negative x 2.  Her BMP and CBC were unremarkable.  She was negative for PE on CT.  She was started on HCTZ for her blood pressure and lower extremity edema.  She was instructed to follow-up with PCP and cardiology.  She was discharged in stable condition on 05/08/2023.  She presents to the clinic today for follow-up evaluation and states that her blood pressure has been much better on  HCTZ.  We reviewed her hospital visit and she expressed understanding.  She has not been monitoring her blood pressure at home.  Today in the clinic it is 138/82.  I will continue her HCTZ.  We reviewed secondary causes of hypertension.  She expressed understanding.  I will have her reduce her caffeine intake.  We will give salty 6 diet she, order BMP today, and plan follow-up in 3 to 4 months.  Today she denies chest pain, lower extremity edema, fatigue, palpitations, melena, hematuria,  hemoptysis, and diaphoresis.   Home Medications    Prior to Admission medications   Medication Sig Start Date End Date Taking? Authorizing Provider  albuterol (VENTOLIN HFA) 108 (90 Base) MCG/ACT inhaler Inhale 2 puffs into the lungs every 6 (six) hours as needed for wheezing or shortness of breath. 11/01/20   Revankar, Aundra Dubin, MD  ALBUTEROL IN Inhale 2 puffs into the lungs as needed.     [provider]  amLODipine (NORVASC) 10 MG tablet TAKE 1/2 TABLET(5 MG) BY MOUTH DAILY 11/07/21   Revankar, Aundra Dubin, MD  aspirin 81 MG chewable tablet Chew 81 mg by mouth daily.    [provider]  hydrochlorothiazide (HYDRODIURIL) 25 MG tablet Take 1 tablet (25 mg total) by mouth daily. 05/08/23   Palumbo, April, MD    Family History    Family History  Problem Relation Age of Onset   Hypertension Father    Anesthesia problems Mother    She indicated that the status of her mother is unknown. She indicated that the status of her father is unknown.  Social History    Social History   Socioeconomic History   Marital status: Single    Spouse name: Not on file   Number of children: Not on file   Years of education: Not on file   Highest education level: Not on file  Occupational History   Not on file  Tobacco Use   Smoking status: Former    Current packs/day: 0.00    Average packs/day: 0.3 packs/day for 10.0 years (2.5 ttl pk-yrs)    Types: Cigarettes    Start date: 10/16/2010    Quit date: 10/16/2020    Years since quitting: 2.8   Smokeless tobacco: Never  Substance and Sexual Activity   Alcohol use: Yes    Comment: occassionally   Drug use: No   Sexual activity: Yes  Other Topics Concern   Not on file  Social History Narrative   Not on file   Social Determinants of Health   Financial Resource Strain: Not on file  Food Insecurity: Not on file  Transportation Needs: Not on file  Physical Activity: Not on file  Stress: Not on file  Social Connections: Unknown  (05/07/2023)   Received from [facility]   Social Network    Social Network: Not on file  Intimate Partner Violence: Unknown (05/07/2023)   Received from [facility]   HITS    Physically Hurt: Not on file    Insult or Talk Down To: Not on file    Threaten Physical Harm: Not on file    Scream or Curse: Not on file     Review of Systems    General:  No chills, fever, night sweats or weight changes.  Cardiovascular:  No chest pain, dyspnea on exertion, edema, orthopnea, palpitations, paroxysmal nocturnal dyspnea. Dermatological: No rash, lesions/masses Respiratory: No cough, dyspnea Urologic: No hematuria, dysuria Abdominal:   No nausea, vomiting, diarrhea, bright red blood  per rectum, melena, or hematemesis Neurologic:  No visual changes, wkns, changes in mental status. All other systems reviewed and are otherwise negative except as noted above.  Physical Exam    VS:  There were no vitals taken for this visit. , BMI There is no height or weight on file to calculate BMI. GEN: Well nourished, well developed, in no acute distress. HEENT: normal. Neck: Supple, no JVD, carotid bruits, or masses. Cardiac: RRR, no murmurs, rubs, or gallops. No clubbing, cyanosis, edema.  Radials/DP/PT 2+ and equal bilaterally.  Respiratory:  Respirations regular and unlabored, clear to auscultation bilaterally. GI: Soft, nontender, nondistended, BS + x 4. MS: no deformity or atrophy. Skin: warm and dry, no rash. Neuro:  Strength and sensation are intact. Psych: Normal affect.  Accessory Clinical Findings    Recent Labs: 05/07/2023: Hemoglobin 13.4; Platelets 307 05/23/2023: BUN 16; Creatinine, Ser 0.90; Potassium 4.8; Sodium 138   Recent Lipid Panel No results found for: "CHOL", "TRIG", "HDL", "CHOLHDL", "VLDL", "LDLCALC", "LDLDIRECT"  No BP recorded.  {Refresh Note OR Click here to enter BP  :1}***    ECG personally reviewed by me today-none today.   EKG 05/07/2023  Sinus rhythm no ectopy 92  bpm   Assessment & Plan   1.  Chest discomfort-denies chest pain today.  Recently seen in the emergency department on 05/07/2023.  She was noted to have elevated blood pressure and reported 1 week of chest discomfort.  High-sensitivity troponins negative, CBC unremarkable, BMP unremarkable, chest CT showed no PE. No plans for ischemic evaluation at this time.  Essential hypertension-BP today 132/82.  Started on HCTZ in the ED. Low-sodium diet Secondary causes of hypertension given Maintain blood pressure log Continue HCTZ Repeat BMP   Lower extremity edema-pedal edema has improved with HCTZ. Lower extremity support stockings Elevate lower extremities when not active Continue HCTZ Low-sodium diet  Disposition: Follow-up with Dr. Tomie China or me in 2-3 months.   Thomasene Ripple. Conroy Goracke NP-C     08/22/2023, 7:25 AM [facility] [address] Office [phone] Fax [phone]    I spent 13*** minutes examining this patient, reviewing medications, and using patient centered shared decision making involving her cardiac care.  Prior to her visit I spent greater than 20 minutes reviewing her past medical history,  medications, and prior cardiac tests.

## 2023-08-25 ENCOUNTER — Ambulatory Visit: Payer: BC Managed Care – PPO | Attending: General Practice | Admitting: General Practice

## 2023-08-26 ENCOUNTER — Encounter: Payer: Self-pay | Admitting: General Practice
